# Patient Record
Sex: Male | Born: 1955 | Race: White | Hispanic: No | Marital: Married | Smoking: Former smoker
Health system: Southern US, Community
[De-identification: ages and names within clinical notes are randomized; demographics above are authoritative.]

## PROBLEM LIST (undated history)

## (undated) ENCOUNTER — Ambulatory Visit

## (undated) DIAGNOSIS — F419 Anxiety disorder, unspecified: Secondary | ICD-10-CM

## (undated) DIAGNOSIS — C801 Malignant (primary) neoplasm, unspecified: Secondary | ICD-10-CM

## (undated) DIAGNOSIS — E785 Hyperlipidemia, unspecified: Secondary | ICD-10-CM

## (undated) DIAGNOSIS — Q251 Coarctation of aorta: Secondary | ICD-10-CM

## (undated) DIAGNOSIS — I779 Disorder of arteries and arterioles, unspecified: Secondary | ICD-10-CM

## (undated) DIAGNOSIS — I739 Peripheral vascular disease, unspecified: Secondary | ICD-10-CM

## (undated) DIAGNOSIS — M1990 Unspecified osteoarthritis, unspecified site: Secondary | ICD-10-CM

## (undated) DIAGNOSIS — F32A Depression, unspecified: Secondary | ICD-10-CM

## (undated) DIAGNOSIS — Z87891 Personal history of nicotine dependence: Secondary | ICD-10-CM

## (undated) DIAGNOSIS — C439 Malignant melanoma of skin, unspecified: Secondary | ICD-10-CM

## (undated) DIAGNOSIS — R011 Cardiac murmur, unspecified: Secondary | ICD-10-CM

## (undated) DIAGNOSIS — T7840XA Allergy, unspecified, initial encounter: Secondary | ICD-10-CM

## (undated) DIAGNOSIS — I10 Essential (primary) hypertension: Secondary | ICD-10-CM

## (undated) DIAGNOSIS — F1910 Other psychoactive substance abuse, uncomplicated: Secondary | ICD-10-CM

## (undated) HISTORY — DX: Allergy, unspecified, initial encounter: T78.40XA

## (undated) HISTORY — PX: COLONOSCOPY: SHX174

## (undated) HISTORY — DX: Hyperlipidemia, unspecified: E78.5

## (undated) HISTORY — DX: Disorder of arteries and arterioles, unspecified: I77.9

## (undated) HISTORY — DX: Coarctation of aorta: Q25.1

## (undated) HISTORY — DX: Unspecified osteoarthritis, unspecified site: M19.90

## (undated) HISTORY — DX: Other psychoactive substance abuse, uncomplicated: F19.10

## (undated) HISTORY — DX: Malignant (primary) neoplasm, unspecified: C80.1

## (undated) HISTORY — DX: Anxiety disorder, unspecified: F41.9

## (undated) HISTORY — PX: AORTA SURGERY: SHX548

## (undated) HISTORY — DX: Depression, unspecified: F32.A

## (undated) HISTORY — DX: Malignant melanoma of skin, unspecified: C43.9

## (undated) HISTORY — DX: Essential (primary) hypertension: I10

## (undated) HISTORY — DX: Peripheral vascular disease, unspecified: I73.9

## (undated) HISTORY — DX: Cardiac murmur, unspecified: R01.1

## (undated) HISTORY — PX: FOOT SURGERY: SHX648

## (undated) HISTORY — DX: Personal history of nicotine dependence: Z87.891

---

## 1988-03-13 DIAGNOSIS — C439 Malignant melanoma of skin, unspecified: Secondary | ICD-10-CM

## 1988-03-13 HISTORY — PX: MELANOMA EXCISION: SHX5266

## 1988-03-13 HISTORY — DX: Malignant melanoma of skin, unspecified: C43.9

## 1999-01-04 ENCOUNTER — Encounter: Admission: RE | Admit: 1999-01-04 | Discharge: 1999-04-04 | Payer: Self-pay | Admitting: Specialist

## 1999-01-22 ENCOUNTER — Emergency Department (HOSPITAL_COMMUNITY): Admission: EM | Admit: 1999-01-22 | Discharge: 1999-01-23 | Payer: Self-pay | Admitting: Emergency Medicine

## 1999-03-13 ENCOUNTER — Emergency Department (HOSPITAL_COMMUNITY): Admission: EM | Admit: 1999-03-13 | Discharge: 1999-03-13 | Payer: Self-pay | Admitting: Emergency Medicine

## 1999-11-14 ENCOUNTER — Emergency Department (HOSPITAL_COMMUNITY): Admission: EM | Admit: 1999-11-14 | Discharge: 1999-11-14 | Payer: Self-pay | Admitting: Emergency Medicine

## 1999-11-25 ENCOUNTER — Emergency Department (HOSPITAL_COMMUNITY): Admission: EM | Admit: 1999-11-25 | Discharge: 1999-11-25 | Payer: Self-pay | Admitting: Emergency Medicine

## 2000-02-11 ENCOUNTER — Emergency Department (HOSPITAL_COMMUNITY): Admission: EM | Admit: 2000-02-11 | Discharge: 2000-02-11 | Payer: Self-pay | Admitting: Emergency Medicine

## 2000-05-15 ENCOUNTER — Emergency Department (HOSPITAL_COMMUNITY): Admission: EM | Admit: 2000-05-15 | Discharge: 2000-05-15 | Payer: Self-pay | Admitting: Emergency Medicine

## 2000-09-19 ENCOUNTER — Emergency Department (HOSPITAL_COMMUNITY): Admission: EM | Admit: 2000-09-19 | Discharge: 2000-09-20 | Payer: Self-pay | Admitting: Emergency Medicine

## 2000-11-05 ENCOUNTER — Emergency Department (HOSPITAL_COMMUNITY): Admission: EM | Admit: 2000-11-05 | Discharge: 2000-11-05 | Payer: Self-pay | Admitting: Emergency Medicine

## 2000-12-13 ENCOUNTER — Emergency Department (HOSPITAL_COMMUNITY): Admission: EM | Admit: 2000-12-13 | Discharge: 2000-12-13 | Payer: Self-pay

## 2001-01-15 ENCOUNTER — Emergency Department (HOSPITAL_COMMUNITY): Admission: EM | Admit: 2001-01-15 | Discharge: 2001-01-15 | Payer: Self-pay

## 2001-04-05 ENCOUNTER — Emergency Department (HOSPITAL_COMMUNITY): Admission: EM | Admit: 2001-04-05 | Discharge: 2001-04-05 | Payer: Self-pay | Admitting: Emergency Medicine

## 2001-06-30 ENCOUNTER — Emergency Department (HOSPITAL_COMMUNITY): Admission: EM | Admit: 2001-06-30 | Discharge: 2001-06-30 | Payer: Self-pay | Admitting: Emergency Medicine

## 2001-09-09 ENCOUNTER — Emergency Department (HOSPITAL_COMMUNITY): Admission: EM | Admit: 2001-09-09 | Discharge: 2001-09-09 | Payer: Self-pay | Admitting: Emergency Medicine

## 2005-08-23 ENCOUNTER — Encounter: Admission: RE | Admit: 2005-08-23 | Discharge: 2005-08-23 | Payer: Self-pay | Admitting: Neurology

## 2005-10-12 ENCOUNTER — Ambulatory Visit: Payer: Self-pay

## 2006-01-26 ENCOUNTER — Emergency Department (HOSPITAL_COMMUNITY): Admission: EM | Admit: 2006-01-26 | Discharge: 2006-01-26 | Payer: Self-pay | Admitting: Emergency Medicine

## 2010-01-11 ENCOUNTER — Encounter: Admission: RE | Admit: 2010-01-11 | Discharge: 2010-01-11 | Payer: Self-pay | Admitting: Neurology

## 2011-03-25 ENCOUNTER — Ambulatory Visit (INDEPENDENT_AMBULATORY_CARE_PROVIDER_SITE_OTHER): Payer: BC Managed Care – PPO

## 2011-03-25 DIAGNOSIS — M546 Pain in thoracic spine: Secondary | ICD-10-CM

## 2011-03-25 DIAGNOSIS — M62838 Other muscle spasm: Secondary | ICD-10-CM

## 2011-03-25 DIAGNOSIS — M79609 Pain in unspecified limb: Secondary | ICD-10-CM

## 2011-04-02 ENCOUNTER — Ambulatory Visit (INDEPENDENT_AMBULATORY_CARE_PROVIDER_SITE_OTHER): Payer: BC Managed Care – PPO

## 2011-04-02 DIAGNOSIS — R05 Cough: Secondary | ICD-10-CM

## 2011-04-02 DIAGNOSIS — M546 Pain in thoracic spine: Secondary | ICD-10-CM

## 2011-05-30 ENCOUNTER — Other Ambulatory Visit: Payer: Self-pay | Admitting: Neurology

## 2011-05-30 DIAGNOSIS — R0989 Other specified symptoms and signs involving the circulatory and respiratory systems: Secondary | ICD-10-CM

## 2011-06-01 ENCOUNTER — Ambulatory Visit
Admission: RE | Admit: 2011-06-01 | Discharge: 2011-06-01 | Disposition: A | Discharge status: Home / Self Care | Payer: PRIVATE HEALTH INSURANCE | Source: Ambulatory Visit | Attending: Neurology | Admitting: Neurology

## 2011-06-01 DIAGNOSIS — R0989 Other specified symptoms and signs involving the circulatory and respiratory systems: Secondary | ICD-10-CM

## 2011-07-01 ENCOUNTER — Ambulatory Visit (INDEPENDENT_AMBULATORY_CARE_PROVIDER_SITE_OTHER): Payer: BC Managed Care – PPO | Admitting: Family Medicine

## 2011-07-01 VITALS — BP 135/87 | HR 70 | Temp 98.1°F | Resp 16 | Ht 69.5 in | Wt 162.0 lb

## 2011-07-01 DIAGNOSIS — Z8774 Personal history of (corrected) congenital malformations of heart and circulatory system: Secondary | ICD-10-CM | POA: Insufficient documentation

## 2011-07-01 DIAGNOSIS — R071 Chest pain on breathing: Secondary | ICD-10-CM

## 2011-07-01 DIAGNOSIS — R0789 Other chest pain: Secondary | ICD-10-CM

## 2011-07-01 MED ORDER — METHYLPREDNISOLONE 4 MG PO KIT: PACK | ORAL | Status: AC

## 2011-07-01 NOTE — Progress Notes (Signed)
56 yo man with 3 months of right parascapular pain with deep breath or reaching forward.  Got some relief from the prednisone in the past  O: point tender right parascapular bony prominence. Chest clear Heart:  Low pitched systolic murmur best heard at the left sternal border  Occasional skipped beat  A:  Rib subluxation  P:  Chiropractor may help Medrol dospak

## 2011-07-01 NOTE — Patient Instructions (Signed)
Rib subluxation   Chest Wall Pain Chest wall pain is pain in or around the bones and muscles of your chest. It may take up to 6 weeks to get better. It may take longer if you must stay physically active in your work and activities.  CAUSES  Chest wall pain may happen on its own. However, it may be caused by:  A viral illness like the flu.   Injury.   Coughing.   Exercise.   Arthritis.   Fibromyalgia.   Shingles.  HOME CARE INSTRUCTIONS   Avoid overtiring physical activity. Try not to strain or perform activities that cause pain. This includes any activities using your chest or your abdominal and side muscles, especially if heavy weights are used.   Put ice on the sore area.   Put ice in a plastic bag.   Place a towel between your skin and the bag.   Leave the ice on for 15 to 20 minutes per hour while awake for the first 2 days.   Only take over-the-counter or prescription medicines for pain, discomfort, or fever as directed by your caregiver.  SEEK IMMEDIATE MEDICAL CARE IF:   Your pain increases, or you are very uncomfortable.   You have a fever.   Your chest pain becomes worse.   You have new, unexplained symptoms.   You have nausea or vomiting.   You feel sweaty or lightheaded.   You have a cough with phlegm (sputum), or you cough up blood.  MAKE SURE YOU:   Understand these instructions.   Will watch your condition.   Will get help right away if you are not doing well or get worse.  Document Released: 02/27/2005 Document Revised: 02/16/2011 Document Reviewed: 10/24/2010 [facility] Patient Information 2012 [place], [place].

## 2011-07-05 ENCOUNTER — Telehealth: Payer: Self-pay

## 2011-07-05 NOTE — Telephone Encounter (Signed)
LMOM  To call back

## 2011-07-05 NOTE — Telephone Encounter (Signed)
LMOM that he could pick xray up at front desk.

## 2011-07-05 NOTE — Telephone Encounter (Signed)
PT REQUESTING COPIES OF CHEST X-RAY (CD)    BEST PHONE [phone]

## 2011-09-12 ENCOUNTER — Ambulatory Visit (INDEPENDENT_AMBULATORY_CARE_PROVIDER_SITE_OTHER): Payer: BC Managed Care – PPO | Admitting: Family Medicine

## 2011-09-12 VITALS — BP 128/78 | HR 66 | Temp 98.6°F | Resp 16 | Ht 69.5 in | Wt 155.0 lb

## 2011-09-12 DIAGNOSIS — M7711 Lateral epicondylitis, right elbow: Secondary | ICD-10-CM

## 2011-09-12 DIAGNOSIS — M7710 Lateral epicondylitis, unspecified elbow: Secondary | ICD-10-CM

## 2011-09-12 NOTE — Progress Notes (Signed)
Subjective:    Patient ID: Gabriel Pittman, male    DOB: 01-16-1956, 56 y.o.   MRN: [id number]  HPI Gabriel Pittman is a 56 y.o. male Hx of R elbow pain - R lateral epicondylosis.  Bowler.  Seen 10/12: counterforce bracing 11/12- recheck with Dr. Georgiana Shore - kenalog 40mg  and 1cc lidocaine injected.  1/13: seen again by Dr. Georgiana Shore - prednisone taper, and working on technique.   Went to [facility], had PT - 5 episodes.  Working on [employer].   R elbow sore again - past 2 months. Would like injection.  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   Only using sometimes.  Tried some different grips with bowling to help - no relief.   Review of Systems  Musculoskeletal:       Pain.  Skin: Negative for pallor and rash.         Objective:   Physical Exam  Constitutional: He is oriented to person, place, and time. He appears well-developed and well-nourished.  HENT:  Head: Normocephalic and atraumatic.  Pulmonary/Chest: Effort normal.  Musculoskeletal:       Right elbow: He exhibits normal range of motion, no swelling and no effusion. tenderness found. Lateral epicondyle tenderness noted.       Arms: Neurological: He is alert and oriented to person, place, and time.  Skin: Skin is warm and dry.  Psychiatric: He has a normal mood and affect. His behavior is normal.   Patient appeared irritated after I discussed that repeat injection not advisable today due to worse long term outcomes.  He stated he had waited 2 1/2 hours for this.  I apologized for his wait, and advised him that in addition to my time evaluating him in the room, I also spent the other time in review of Up to Date recommendations, and phone consult with [facility] physician, to determine most advisable course for his condition as it was longstanding with recurrence.    Assessment & Plan:  [name] is a 56 y.o. male R lateral epicondylosis - recurrent.  Status post counterforce  bracing in 10/12, corticosteroid injection in 11/12, then prednisone taper in 1/13.  Status post physical therapy in March of this year. Improved initially for about 1-2 months after injection, so desired repeat injection today.  Discussed evidence of long term worse consequences with repeat injection, and for this reason injection was not a good option today. I also advised him this was not only my recommendation, but from Up To Date, and as recommended on phone discussion with other sports medicine specialist. Multiple other management options given to patient.  Discussed ultrasound or MRI of area to rule out tear with persistent symptoms, or referral to [facility] for ultrasound of tendon and eval - patient declined.  Discussed nitroglycerin patch to area - patient declined.  Discussed referral to hand specialist/ortho - patient declined.  He stated he plans on setting up his own appointment with Dr. Yisroel Ramming.  I told him that I would be happy to refer him to Dr. Yisroel Ramming for eval, but he declined my referral.  I did recommend he continue eccentric rehab HEP from PT and counterforce bracing with activity/work.  When I discussed analgesics as he did say that he had woken up with pain at times - he declined me writing any prescription for him to help with this and just wanted to leave.  If he calls and changes his mind about referral or above options, I can refer at that time.

## 2012-04-04 ENCOUNTER — Other Ambulatory Visit: Payer: Self-pay | Admitting: Neurology

## 2012-04-04 DIAGNOSIS — I6529 Occlusion and stenosis of unspecified carotid artery: Secondary | ICD-10-CM

## 2012-04-11 ENCOUNTER — Ambulatory Visit
Admission: RE | Admit: 2012-04-11 | Discharge: 2012-04-11 | Disposition: A | Discharge status: Home / Self Care | Payer: BC Managed Care – PPO | Source: Ambulatory Visit | Attending: Neurology | Admitting: Neurology

## 2012-04-11 DIAGNOSIS — I6529 Occlusion and stenosis of unspecified carotid artery: Secondary | ICD-10-CM

## 2012-12-26 ENCOUNTER — Other Ambulatory Visit: Payer: Self-pay | Admitting: Podiatry

## 2013-01-23 ENCOUNTER — Ambulatory Visit (INDEPENDENT_AMBULATORY_CARE_PROVIDER_SITE_OTHER): Payer: BC Managed Care – PPO | Admitting: Podiatry

## 2013-01-23 ENCOUNTER — Encounter: Payer: Self-pay | Admitting: Podiatry

## 2013-01-23 VITALS — BP 159/80 | HR 70 | Resp 12 | Ht 69.0 in | Wt 150.0 lb

## 2013-01-23 DIAGNOSIS — M7750 Other enthesopathy of unspecified foot: Secondary | ICD-10-CM

## 2013-01-23 MED ORDER — DICLOFENAC SODIUM 75 MG PO TBEC: 75.0000 mg | DELAYED_RELEASE_TABLET | Freq: Two times a day (BID) | ORAL | Status: DC

## 2013-01-23 NOTE — Progress Notes (Signed)
Subjective:     Patient ID: Gabriel Pittman, male   DOB: 07/08/1955, 57 y.o.   MRN: [id number]  HPI patient states that his heel is improved but that he is having pain underneath the ankle joint. States that he is walking better than previously with the heel pain   Review of Systems     Objective:   Physical Exam  Nursing note and vitals reviewed. Constitutional: He is oriented to person, place, and time.  Cardiovascular: Intact distal pulses.   Musculoskeletal: Normal range of motion.  Neurological: He is oriented to person, place, and time.  Skin: Skin is warm.   patient is found to have pain around the posterior tibial tendon left with mild inflammation noted. Heel itself is doing very well     Assessment:     Posterior tibial tendinitis left with improved plantar fasciitis    Plan:     Advised on posterior tibial tendinitis and discussed ice therapy and diclofenac oral anti-inflammatory. If not improved we'll need to consider injection of this area

## 2013-02-20 ENCOUNTER — Encounter: Payer: Self-pay | Admitting: Podiatry

## 2013-02-20 ENCOUNTER — Ambulatory Visit (INDEPENDENT_AMBULATORY_CARE_PROVIDER_SITE_OTHER): Payer: BC Managed Care – PPO | Admitting: Podiatry

## 2013-02-20 VITALS — BP 136/76 | HR 66 | Resp 16

## 2013-02-20 DIAGNOSIS — M722 Plantar fascial fibromatosis: Secondary | ICD-10-CM

## 2013-02-20 MED ORDER — TRIAMCINOLONE ACETONIDE 10 MG/ML IJ SUSP
10.0000 mg | Freq: Once | INTRAMUSCULAR | Status: AC
  Administered 2013-02-20: 10 mg

## 2013-02-24 MED ORDER — TRIAMCINOLONE ACETONIDE 10 MG/ML IJ SUSP: 10.0000 mg | Freq: Once | INTRAMUSCULAR | Status: DC

## 2013-02-24 NOTE — Progress Notes (Signed)
Subjective:     Patient ID: Gabriel Pittman, male   DOB: 02/28/1956, 57 y.o.   MRN: [id number]  HPI patient states that his heel is still bothering him but improved from previously   Review of Systems     Objective:   Physical Exam Neurovascular status intact with pain to palpation to the plantar heel still noted with the posterior tibial tendon doing well    Assessment:     Plantar fasciitis left noted    Plan:     Injected the left plantar fascia 3 mm, 5 of them Xylocaine Marcaine mixture and gave advice on physical therapy orthotics and support

## 2013-03-20 ENCOUNTER — Encounter: Payer: Self-pay | Admitting: Podiatry

## 2013-03-20 ENCOUNTER — Ambulatory Visit (INDEPENDENT_AMBULATORY_CARE_PROVIDER_SITE_OTHER): Payer: BC Managed Care – PPO | Admitting: Podiatry

## 2013-03-20 VITALS — BP 153/85 | HR 67 | Resp 16 | Ht 69.0 in | Wt 150.0 lb

## 2013-03-20 DIAGNOSIS — M722 Plantar fascial fibromatosis: Secondary | ICD-10-CM

## 2013-03-20 MED ORDER — TRIAMCINOLONE ACETONIDE 10 MG/ML IJ SUSP
10.0000 mg | Freq: Once | INTRAMUSCULAR | Status: AC
  Administered 2013-03-20: 10 mg

## 2013-03-20 NOTE — Progress Notes (Signed)
Pt presents for follow up for plantar fasciitis and medial foot pain.

## 2013-03-20 NOTE — Progress Notes (Signed)
Subjective:     Patient ID: Gabriel Pittman, male   DOB: 26-May-1955, 58 y.o.   MRN: [id number]  HPI patient is doing much better with heel pain but on the right foot a slightly bit more proximal there is inflammation of the medial band   Review of Systems     Objective:   Physical Exam Neurovascular status unchanged with discomfort of a more distal direction on the plantar fascia right with good heel at the current time    Assessment:     Distal plantar fasciitis in one small area noted    Plan:     Careful injection administered to this area 3 mg Kenalog 5 mg Xylocaine Marcaine mixture and patient will be seen back as needed

## 2013-04-05 ENCOUNTER — Ambulatory Visit (INDEPENDENT_AMBULATORY_CARE_PROVIDER_SITE_OTHER): Payer: BC Managed Care – PPO | Admitting: Family Medicine

## 2013-04-05 VITALS — BP 128/60 | HR 50 | Temp 98.4°F | Resp 16 | Ht 69.0 in | Wt 148.0 lb

## 2013-04-05 DIAGNOSIS — M542 Cervicalgia: Secondary | ICD-10-CM

## 2013-04-05 MED ORDER — METAXALONE 800 MG PO TABS: 800.0000 mg | ORAL_TABLET | Freq: Three times a day (TID) | ORAL | Status: DC

## 2013-04-05 MED ORDER — HYDROCODONE-ACETAMINOPHEN 10-325 MG PO TABS: 1.0000 | ORAL_TABLET | Freq: Three times a day (TID) | ORAL | Status: DC | PRN

## 2013-04-05 NOTE — Patient Instructions (Signed)
Take the Skelaxin one 3 times daily  Continue the ibuprofen for an 800 mg (4x200)  3 times daily  Hydrocodone 5 mg every 4-6 hours if needed for pain  Ice/heat alternating  Return if worse or not improving

## 2013-04-05 NOTE — Progress Notes (Signed)
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 He does a lot of physical exercise also. One week ago today he was doing stretches of his back and neck and hold his left side of the neck. It is continued to hurt him since then. Last for 5 nights she's not been able to rest because of it. He has been taking over-the-counter ibuprofen. Relief is only transient. He continues to hurt in the left posterior neck. It feels tight but no radiation. He gets a lot of exercise on a regular basis anyhow. It just is not resolved over the last week.  He has had MRI scans of his neck in the past and knows that he has cervical arthritis.  Objective: Point tenderness in the right cervical area. neck has satisfactory range of motion. There is not much tenderness in the surrounding tissues. Neck hurts with flexion or rotation grip and function of his upper trapezius good  Assessment: Cervical strain Cervical arthritis  Plan: Skelaxin If the ibuprofen Hydrocodone 5/325 when necessary  If he does not improve we will consider some physical therapy and/or further studies

## 2013-05-02 ENCOUNTER — Other Ambulatory Visit: Payer: Self-pay | Admitting: Neurology

## 2013-05-02 DIAGNOSIS — I6529 Occlusion and stenosis of unspecified carotid artery: Secondary | ICD-10-CM

## 2013-05-08 ENCOUNTER — Other Ambulatory Visit: Payer: BC Managed Care – PPO

## 2013-05-09 ENCOUNTER — Ambulatory Visit
Admission: RE | Admit: 2013-05-09 | Discharge: 2013-05-09 | Disposition: A | Discharge status: Home / Self Care | Payer: BC Managed Care – PPO | Source: Ambulatory Visit | Attending: Neurology | Admitting: Neurology

## 2013-05-09 DIAGNOSIS — I6529 Occlusion and stenosis of unspecified carotid artery: Secondary | ICD-10-CM

## 2013-05-14 ENCOUNTER — Other Ambulatory Visit: Payer: Self-pay | Admitting: Neurology

## 2013-05-14 DIAGNOSIS — I6521 Occlusion and stenosis of right carotid artery: Secondary | ICD-10-CM

## 2013-05-21 ENCOUNTER — Ambulatory Visit
Admission: RE | Admit: 2013-05-21 | Discharge: 2013-05-21 | Disposition: A | Discharge status: Home / Self Care | Payer: BC Managed Care – PPO | Source: Ambulatory Visit | Attending: Neurology | Admitting: Neurology

## 2013-05-21 DIAGNOSIS — I6521 Occlusion and stenosis of right carotid artery: Secondary | ICD-10-CM

## 2013-05-21 MED ORDER — GADOBENATE DIMEGLUMINE 529 MG/ML IV SOLN
13.0000 mL | Freq: Once | INTRAVENOUS | Status: AC | PRN
  Administered 2013-05-21: 13 mL via INTRAVENOUS

## 2013-05-23 ENCOUNTER — Other Ambulatory Visit: Payer: Self-pay | Admitting: Neurology

## 2013-05-23 DIAGNOSIS — I6529 Occlusion and stenosis of unspecified carotid artery: Secondary | ICD-10-CM

## 2013-05-29 ENCOUNTER — Ambulatory Visit
Admission: RE | Admit: 2013-05-29 | Discharge: 2013-05-29 | Disposition: A | Discharge status: Home / Self Care | Payer: BC Managed Care – PPO | Source: Ambulatory Visit | Attending: Neurology | Admitting: Neurology

## 2013-05-29 DIAGNOSIS — I6529 Occlusion and stenosis of unspecified carotid artery: Secondary | ICD-10-CM

## 2013-05-29 MED ORDER — IOHEXOL 350 MG/ML SOLN
100.0000 mL | Freq: Once | INTRAVENOUS | Status: AC | PRN
  Administered 2013-05-29: 100 mL via INTRAVENOUS

## 2013-09-01 ENCOUNTER — Ambulatory Visit (INDEPENDENT_AMBULATORY_CARE_PROVIDER_SITE_OTHER): Payer: BC Managed Care – PPO | Admitting: Podiatry

## 2013-09-01 ENCOUNTER — Encounter: Payer: Self-pay | Admitting: Podiatry

## 2013-09-01 DIAGNOSIS — M7750 Other enthesopathy of unspecified foot: Secondary | ICD-10-CM

## 2013-09-01 MED ORDER — TRIAMCINOLONE ACETONIDE 10 MG/ML IJ SUSP
10.0000 mg | Freq: Once | INTRAMUSCULAR | Status: AC
  Administered 2013-09-01: 10 mg

## 2013-09-02 NOTE — Progress Notes (Signed)
Subjective:     Patient ID: Gabriel Pittman, male   DOB: 12-Aug-1955, 59 y.o.   MRN: [id number]  HPI patient presents stating he has had a reoccurrence of heel pain right and it is very tender at this time especially when he gets up in the morning   Review of Systems     Objective:   Physical Exam Neurovascular status intact no change in health history with exquisite discomfort plantar aspect right heel at the insertional point of the tendon into the calcaneus    Assessment:     Plan her fasciitis right heel with inflammation    Plan:     Reinjected the plantar fascia 3 mg Kenalog 5 mg like Marcaine mixture and dispensed night splint with all instructions on usage along with ice pack. Reappoint her recheck in 4 weeks and continue orthotics and physical therapy

## 2013-10-06 ENCOUNTER — Ambulatory Visit (INDEPENDENT_AMBULATORY_CARE_PROVIDER_SITE_OTHER): Payer: BC Managed Care – PPO | Admitting: Podiatry

## 2013-10-06 ENCOUNTER — Encounter: Payer: Self-pay | Admitting: Podiatry

## 2013-10-06 VITALS — BP 125/78 | HR 74 | Resp 17

## 2013-10-06 DIAGNOSIS — M7750 Other enthesopathy of unspecified foot: Secondary | ICD-10-CM

## 2013-10-06 DIAGNOSIS — M722 Plantar fascial fibromatosis: Secondary | ICD-10-CM

## 2013-10-06 MED ORDER — TRIAMCINOLONE ACETONIDE 10 MG/ML IJ SUSP
10.0000 mg | Freq: Once | INTRAMUSCULAR | Status: AC
  Administered 2013-10-06: 10 mg

## 2013-10-06 MED ORDER — DICLOFENAC SODIUM 75 MG PO TBEC: 75.0000 mg | DELAYED_RELEASE_TABLET | Freq: Two times a day (BID) | ORAL | Status: DC

## 2013-10-06 NOTE — Progress Notes (Signed)
Subjective:     Patient ID: Gabriel Pittman, male   DOB: 05/05/1955, 58 y.o.   MRN: [id number]  HPI patient presents stating this pain in the one area still bothers me. States the heel was better and the tendency [place] spot in between the get irritated   Review of Systems     Objective:   Physical Exam Neurovascular status intact with muscle strength adequate and patient noted to have discomfort not in the posterior tibial tendon or plantar fashion but somewhere in between with a small inflammatory area noted in this area    Assessment:     Appears to be some form of inflammatory condition it's difficult to determine exactly what it is    Plan:     Since this area is new I did do 1 injection 3 mg Kenalog 5 mg I can Marcaine mixture and I instructed him on continued boot usage orthotic usage and reappoint 4 weeks earlier if symptoms were to get worse

## 2013-10-06 NOTE — Progress Notes (Signed)
   Subjective:    Patient ID: Gabriel Pittman, male    DOB: 11/11/1955, 58 y.o.   MRN: [id number]  HPI  Pt is here for f/u injection for plantar fasciitis. He states that he noticed a 50% improvement, still tender/sore  Review of Systems     Objective:   Physical Exam        Assessment & Plan:

## 2013-11-03 ENCOUNTER — Ambulatory Visit (INDEPENDENT_AMBULATORY_CARE_PROVIDER_SITE_OTHER): Payer: BC Managed Care – PPO | Admitting: Podiatry

## 2013-11-03 ENCOUNTER — Encounter: Payer: Self-pay | Admitting: Podiatry

## 2013-11-03 VITALS — BP 145/78 | HR 68 | Resp 16

## 2013-11-03 DIAGNOSIS — M722 Plantar fascial fibromatosis: Secondary | ICD-10-CM

## 2013-11-03 NOTE — Progress Notes (Signed)
Subjective:     Patient ID: Gabriel Pittman, male   DOB: [birth year], 58 y.o.   MRN: [id number]  HPI patient states that the right heel is feeling much better   Review of Systems     Objective:   Physical Exam Neurovascular status intact with significant reduction discomfort that is noted in the right plantar heel    Assessment:     Patient doing well with significant reduction of pain upon palpate    Plan:     H&P reviewed and discussed physical therapy supportive shoe orthotics and night splint usage. Patient is discharged and less any issues should occur

## 2013-11-10 ENCOUNTER — Ambulatory Visit (INDEPENDENT_AMBULATORY_CARE_PROVIDER_SITE_OTHER): Payer: BC Managed Care – PPO | Admitting: Physician Assistant

## 2013-11-10 VITALS — BP 142/68 | HR 64 | Temp 98.0°F | Resp 16 | Ht 69.0 in | Wt 144.8 lb

## 2013-11-10 DIAGNOSIS — Z9889 Other specified postprocedural states: Secondary | ICD-10-CM

## 2013-11-10 DIAGNOSIS — Z85820 Personal history of malignant melanoma of skin: Secondary | ICD-10-CM | POA: Insufficient documentation

## 2013-11-10 DIAGNOSIS — L259 Unspecified contact dermatitis, unspecified cause: Secondary | ICD-10-CM

## 2013-11-10 DIAGNOSIS — L309 Dermatitis, unspecified: Secondary | ICD-10-CM

## 2013-11-10 MED ORDER — CLOTRIMAZOLE-BETAMETHASONE 1-0.05 % EX CREA: 1.0000 "application " | TOPICAL_CREAM | Freq: Two times a day (BID) | CUTANEOUS | Status: DC

## 2013-11-10 NOTE — Progress Notes (Signed)
   Subjective:    Patient ID: Gabriel Pittman, male    DOB: 1955/11/17, 58 y.o.   MRN: [id number]   PCP: Edison Nasuti, MD  Chief Complaint  Patient presents with  . Rash    Located on buttocks, x4 weeks    Medications, allergies, past medical history, surgical history, family history, social history and problem list reviewed and updated.  Patient Active Problem List   Diagnosis Date Noted  . History of melanoma excision 11/10/2013  . 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 Authorizing Provider  aspirin 325 MG tablet Take 166 mg by mouth daily.    Yes Historical Provider, MD  diclofenac (VOLTAREN) 75 MG EC tablet Take 1 tablet (75 mg total) by mouth 2 (two) times daily. 10/06/13  Yes Wallene Huh, DPM    HPI  "I think I have Monkey Butt." "I've had this kind of rash before. It usually goes away in a few days or a week." OTC Cortisone cream and Baby Oil without benefit. Switched from [employer] to [place] 2 days ago, and notes he feels some better today. Itchy and painful to sit.  He exercises regularly, doesn't always get out of his wet and sweaty clothes promptly.  Review of Systems     Objective:   Physical Exam  Constitutional: He is oriented to person, place, and time. He appears well-developed and well-nourished. No distress.  BP 142/68  Pulse 64  Temp(Src) 98 F (36.7 C) (Oral)  Resp 16  Ht 5\' 9"  (1.753 m)  Wt 144 lb 12.8 oz (65.681 kg)  BMI 21.37 kg/m2  SpO2 98%   Pulmonary/Chest: Effort normal.  Neurological: He is alert and oriented to person, place, and time.  Skin: Skin is warm and dry. There is erythema (surrounding the anus and extending several centimeters up the intergluteal cleft. No ulcerations. No satellite lesions.).  Psychiatric: He has a normal mood and affect. His speech is normal and behavior is normal.          Assessment & Plan:  1.  Dermatitis Continue with Dove soap rather than Dial. Change out of damp clothing as quickly as possible. - clotrimazole-betamethasone (LOTRISONE) cream; Apply 1 application topically 2 (two) times daily.  Dispense: 45 g; Refill: 1   Fara Chute, PA-C Physician Assistant-Certified [facility]

## 2013-11-10 NOTE — Patient Instructions (Signed)
Use the cream twice daily until the area is resolved and then 5 additional days. Return for re-evaluation if your symptoms persist in 2 weeks.  Try Body Glide to reduce chafing.

## 2013-12-24 ENCOUNTER — Ambulatory Visit (INDEPENDENT_AMBULATORY_CARE_PROVIDER_SITE_OTHER): Payer: BC Managed Care – PPO | Admitting: Physician Assistant

## 2013-12-24 VITALS — BP 120/80 | HR 61 | Temp 98.1°F | Resp 16 | Ht 69.0 in | Wt 145.0 lb

## 2013-12-24 DIAGNOSIS — R21 Rash and other nonspecific skin eruption: Secondary | ICD-10-CM

## 2013-12-24 LAB — POCT CBC
Granulocyte percent: 74.8 %G (ref 37–80)
HEMATOCRIT: 43 % — AB (ref 43.5–53.7)
HEMOGLOBIN: 14.2 g/dL (ref 14.1–18.1)
LYMPH, POC: 2 (ref 0.6–3.4)
MCH: 33.3 pg — AB (ref 27–31.2)
MCHC: 33 g/dL (ref 31.8–35.4)
MCV: 100.8 fL — AB (ref 80–97)
MID (cbc): 0.7 (ref 0–0.9)
MPV: 6.8 fL (ref 0–99.8)
POC Granulocyte: 7.8 — AB (ref 2–6.9)
POC LYMPH PERCENT: 18.8 %L (ref 10–50)
POC MID %: 6.4 %M (ref 0–12)
Platelet Count, POC: 250 10*3/uL (ref 142–424)
RBC: 4.27 M/uL — AB (ref 4.69–6.13)
RDW, POC: 13.8 %
WBC: 10.4 10*3/uL — AB (ref 4.6–10.2)

## 2013-12-24 LAB — GLUCOSE, POCT (MANUAL RESULT ENTRY): POC GLUCOSE: 64 mg/dL — AB (ref 70–99)

## 2013-12-24 MED ORDER — BALMEX 11.3 % EX CREA: 1.0000 "application " | TOPICAL_CREAM | Freq: Two times a day (BID) | CUTANEOUS | Status: DC

## 2013-12-24 NOTE — Progress Notes (Signed)
Subjective:    Patient ID: Gabriel Pittman, male    DOB: 1955/05/11, 58 y.o.   MRN: [id number]  HPI  This is a 58 year old male with 3.5 month history of perianal rash. He was seen here in 11/10/13 for this issue and was prescribed lotrisone cream. He reports the rash improved some during the first 2 weeks of using the cream but then worsened again. He is still using the cream twice a day. The rash is itchy. He reports it is painful to sit.  After last visit he has started being more conscious about taking his damp work out clothes off promptly. He had switched to using dove after he was last seen but has switched back to dial. He denies painful bowel movements, anal discharge, fever or chills.  Review of Systems  Constitutional: Negative.   HENT: Negative for sore throat.   Gastrointestinal: Positive for rectal pain. Negative for abdominal pain, diarrhea, blood in stool and anal bleeding.  Endocrine: Negative for polydipsia and polyuria.  Skin: Positive for rash (perianal).   Current Outpatient Prescriptions on File Prior to Visit  Medication Sig Dispense Refill  . aspirin 325 MG tablet Take 166 mg by mouth daily.       . clotrimazole-betamethasone (LOTRISONE) cream Apply 1 application topically 2 (two) times daily.  45 g  1  . diclofenac (VOLTAREN) 75 MG EC tablet Take 1 tablet (75 mg total) by mouth 2 (two) times daily.  50 tablet  2   No current facility-administered medications on file prior to visit.    Patient Active Problem List   Diagnosis Date Noted  . History of melanoma excision 11/10/2013  . 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 No distress.  HENT:  Head: Normocephalic and atraumatic.  Right Ear: Hearing normal.  Left Ear: Hearing normal.  Mouth/Throat: Uvula is midline and oropharynx is clear and moist. No oropharyngeal exudate or posterior  oropharyngeal erythema.  Eyes: Conjunctivae and lids are normal.  Genitourinary: Rectal exam shows no external hemorrhoid and no fissure.  Perianal erythema extending into intergluteal cleft. No satellite lesions. No skin erosions or blisters.  Neurological: He is alert.  Skin: Skin is warm and dry. Rash (perianal) noted. He is not diaphoretic.  Psychiatric: He has a normal mood and affect. His speech is normal and behavior is normal. Thought content normal.   Results for orders placed in visit on 12/24/13  POCT CBC      Result Value Ref Range   WBC 10.4 (*) 4.6 - 10.2 K/uL   Lymph, poc 2.0  0.6 - 3.4   POC LYMPH PERCENT 18.8  10 - 50 %L   MID (cbc) 0.7  0 - 0.9   POC MID % 6.4  0 - 12 %M   POC Granulocyte 7.8 (*) 2 - 6.9   Granulocyte percent 74.8  37 - 80 %G   RBC 4.27 (*) 4.69 - 6.13 M/uL   Hemoglobin 14.2  14.1 - 18.1 g/dL   HCT, POC 43.0 (*) 43.5 - 53.7 %   MCV 100.8 (*) 80 - 97 fL   MCH, POC 33.3 (*) 27 - 31.2 pg   MCHC 33.0  31.8 - 35.4 g/dL   RDW, POC 13.8     Platelet Count, POC 250  142 - 424 K/uL   MPV 6.8  0 - 99.8 fL  GLUCOSE, POCT (MANUAL RESULT ENTRY)      Result Value Ref Range   POC Glucose 64 (*) 70 - 99 mg/dl       Assessment & Plan:  1. Perianal rash  Etiology unknown. Glucose was not elevated, reassured he does not have diabetes. Wbc normal. It is possible the rash is exacerbated by prolonged steroid use. He will discontinue the lotrisone cream. He will start using balmex for barrier protection. He will switch back to dove soap from dial. If this rash continues for longer than 2 weeks he will return. At that time, would consider referral to dermatology or gastroenterology. - POCT CBC - POCT glucose (manual entry) - BALMEX 11.3 % CREA cream; Apply 1 application topically 2 (two) times daily.  Dispense: 1 Tube; Refill: 0   Benjaman Pott. Drenda Freeze, MHS [facility]  12/24/2013

## 2013-12-24 NOTE — Patient Instructions (Signed)
Return in 2 weeks if not improved. Stop steroid cream. Start using dove again.  Eczema Eczema, also called atopic dermatitis, is a skin disorder that causes inflammation of the skin. It causes a red rash and dry, scaly skin. The skin becomes very itchy. Eczema is generally worse during the cooler winter months and often improves with the warmth of summer. Eczema usually starts showing signs in infancy. Some children outgrow eczema, but it may last through adulthood.  CAUSES  The exact cause of eczema is not known, but it appears to run in families. People with eczema often have a family history of eczema, allergies, asthma, or hay fever. Eczema is not contagious. Flare-ups of the condition may be caused by:   Contact with something you are sensitive or allergic to.   Stress. SIGNS AND SYMPTOMS  Dry, scaly skin.   Red, itchy rash.   Itchiness. This may occur before the skin rash and may be very intense.  DIAGNOSIS  The diagnosis of eczema is usually made based on symptoms and medical history. TREATMENT  Eczema cannot be cured, but symptoms usually can be controlled with treatment and other strategies. A treatment plan might include:  Controlling the itching and scratching.   Use over-the-counter antihistamines as directed for itching. This is especially useful at night when the itching tends to be worse.   Use over-the-counter steroid creams as directed for itching.   Avoid scratching. Scratching makes the rash and itching worse. It may also result in a skin infection (impetigo) due to a break in the skin caused by scratching.   Keeping the skin well moisturized with creams every day. This will seal in moisture and help prevent dryness. Lotions that contain alcohol and water should be avoided because they can dry the skin.   Limiting exposure to things that you are sensitive or allergic to (allergens).   Recognizing situations that cause stress.   Developing a plan to  manage stress.  HOME CARE INSTRUCTIONS   Only take over-the-counter or prescription medicines as directed by your health care provider.   Do not use anything on the skin without checking with your health care provider.   Keep baths or showers short (5 minutes) in warm (not hot) water. Use mild cleansers for bathing. These should be unscented. You may add nonperfumed bath oil to the bath water. It is best to avoid soap and bubble bath.   Immediately after a bath or shower, when the skin is still damp, apply a moisturizing ointment to the entire body. This ointment should be a petroleum ointment. This will seal in moisture and help prevent dryness. The thicker the ointment, the better. These should be unscented.   Keep fingernails cut short. Children with eczema may need to wear soft gloves or mittens at night after applying an ointment.   Dress in clothes made of cotton or cotton blends. Dress lightly, because heat increases itching.   A child with eczema should stay away from anyone with fever blisters or cold sores. The virus that causes fever blisters (herpes simplex) can cause a serious skin infection in children with eczema. SEEK MEDICAL CARE IF:   Your itching interferes with sleep.   Your rash gets worse or is not better within 1 week after starting treatment.   You see pus or soft yellow scabs in the rash area.   You have a fever.   You have a rash flare-up after contact with someone who has fever blisters.  Document Released:  02/25/2000 Document Revised: 12/18/2012 Document Reviewed: 09/30/2012 [facility] Patient Information 2015 [place], [place]. This information is not intended to replace advice given to you by your health care provider. Make sure you discuss any questions you have with your health care provider.

## 2013-12-26 NOTE — Progress Notes (Signed)
I have examined this patient along with Ms. Drenda Freeze and agree.

## 2014-01-07 ENCOUNTER — Ambulatory Visit (INDEPENDENT_AMBULATORY_CARE_PROVIDER_SITE_OTHER): Payer: BC Managed Care – PPO | Admitting: Podiatry

## 2014-01-07 ENCOUNTER — Encounter: Payer: Self-pay | Admitting: Podiatry

## 2014-01-07 VITALS — BP 162/78 | HR 66 | Resp 16

## 2014-01-07 DIAGNOSIS — M779 Enthesopathy, unspecified: Secondary | ICD-10-CM

## 2014-01-07 MED ORDER — TRIAMCINOLONE ACETONIDE 10 MG/ML IJ SUSP
10.0000 mg | Freq: Once | INTRAMUSCULAR | Status: AC
  Administered 2014-01-07: 10 mg

## 2014-01-08 NOTE — Progress Notes (Signed)
Subjective:     Patient ID: Gabriel Pittman, male   DOB: 12/04/1955, 58 y.o.   MRN: [id number]  HPI patient presents stating my heel is doing great but I'm having discomfort around that posterior tibial tendon again and I feel like if I could just have a little bit of medicine around it it would get better   Review of Systems     Objective:   Physical Exam Neurovascular status intact with muscle strength adequate and range of motion subtalar midtarsal joint within normal limits. Patient is noted to have mild inflammation around and plantar to the posterior tibial tendon right with no indications of muscle strength loss or indications of excessive swelling with possibility for tear    Assessment:     Probable tendinitis of the right posterior tibial    Plan:     Reviewed condition and did careful injection 3 mg Kenalog 5 mg Xylocaine into the sheath and plantar. Tolerated well and reappoint if symptoms persist for consideration of MRI

## 2014-12-12 DIAGNOSIS — D126 Benign neoplasm of colon, unspecified: Secondary | ICD-10-CM

## 2014-12-12 HISTORY — DX: Benign neoplasm of colon, unspecified: D12.6

## 2014-12-15 ENCOUNTER — Encounter: Payer: Self-pay | Admitting: Podiatry

## 2014-12-15 ENCOUNTER — Ambulatory Visit (INDEPENDENT_AMBULATORY_CARE_PROVIDER_SITE_OTHER): Payer: BLUE CROSS/BLUE SHIELD

## 2014-12-15 ENCOUNTER — Ambulatory Visit (INDEPENDENT_AMBULATORY_CARE_PROVIDER_SITE_OTHER): Payer: BLUE CROSS/BLUE SHIELD | Admitting: Podiatry

## 2014-12-15 DIAGNOSIS — M779 Enthesopathy, unspecified: Secondary | ICD-10-CM

## 2014-12-15 DIAGNOSIS — M722 Plantar fascial fibromatosis: Secondary | ICD-10-CM

## 2014-12-15 MED ORDER — TRIAMCINOLONE ACETONIDE 10 MG/ML IJ SUSP
10.0000 mg | Freq: Once | INTRAMUSCULAR | Status: AC
  Administered 2014-12-15: 10 mg

## 2014-12-16 ENCOUNTER — Encounter: Payer: Self-pay | Admitting: Family Medicine

## 2014-12-16 ENCOUNTER — Telehealth: Payer: Self-pay | Admitting: *Deleted

## 2014-12-16 ENCOUNTER — Ambulatory Visit (INDEPENDENT_AMBULATORY_CARE_PROVIDER_SITE_OTHER): Payer: BLUE CROSS/BLUE SHIELD | Admitting: Family Medicine

## 2014-12-16 VITALS — BP 168/72 | HR 60 | Temp 98.3°F | Resp 16 | Ht 69.0 in | Wt 145.2 lb

## 2014-12-16 DIAGNOSIS — Z0000 Encounter for general adult medical examination without abnormal findings: Secondary | ICD-10-CM

## 2014-12-16 DIAGNOSIS — Z125 Encounter for screening for malignant neoplasm of prostate: Secondary | ICD-10-CM | POA: Diagnosis not present

## 2014-12-16 DIAGNOSIS — Z1211 Encounter for screening for malignant neoplasm of colon: Secondary | ICD-10-CM

## 2014-12-16 DIAGNOSIS — F17200 Nicotine dependence, unspecified, uncomplicated: Secondary | ICD-10-CM

## 2014-12-16 DIAGNOSIS — Z1321 Encounter for screening for nutritional disorder: Secondary | ICD-10-CM | POA: Diagnosis not present

## 2014-12-16 DIAGNOSIS — IMO0001 Reserved for inherently not codable concepts without codable children: Secondary | ICD-10-CM

## 2014-12-16 DIAGNOSIS — R030 Elevated blood-pressure reading, without diagnosis of hypertension: Secondary | ICD-10-CM | POA: Diagnosis not present

## 2014-12-16 DIAGNOSIS — Z13220 Encounter for screening for lipoid disorders: Secondary | ICD-10-CM

## 2014-12-16 DIAGNOSIS — Z23 Encounter for immunization: Secondary | ICD-10-CM | POA: Diagnosis not present

## 2014-12-16 LAB — COMPREHENSIVE METABOLIC PANEL
ALBUMIN: 4.5 g/dL (ref 3.6–5.1)
ALK PHOS: 47 U/L (ref 40–115)
ALT: 20 U/L (ref 9–46)
AST: 22 U/L (ref 10–35)
BILIRUBIN TOTAL: 0.5 mg/dL (ref 0.2–1.2)
BUN: 18 mg/dL (ref 7–25)
CALCIUM: 9 mg/dL (ref 8.6–10.3)
CO2: 28 mmol/L (ref 20–31)
Chloride: 104 mmol/L (ref 98–110)
Creat: 0.87 mg/dL (ref 0.70–1.25)
GLUCOSE: 97 mg/dL (ref 65–99)
Potassium: 4.5 mmol/L (ref 3.5–5.3)
Sodium: 139 mmol/L (ref 135–146)
TOTAL PROTEIN: 6.8 g/dL (ref 6.1–8.1)

## 2014-12-16 LAB — POCT URINALYSIS DIP (MANUAL ENTRY)
BILIRUBIN UA: NEGATIVE
GLUCOSE UA: NEGATIVE
Ketones, POC UA: NEGATIVE
LEUKOCYTES UA: NEGATIVE
NITRITE UA: NEGATIVE
Protein Ur, POC: NEGATIVE
Spec Grav, UA: 1.015
UROBILINOGEN UA: 0.2
pH, UA: 7

## 2014-12-16 LAB — CBC
HEMATOCRIT: 40.5 % (ref 39.0–52.0)
HEMOGLOBIN: 13.8 g/dL (ref 13.0–17.0)
MCH: 32.7 pg (ref 26.0–34.0)
MCHC: 34.1 g/dL (ref 30.0–36.0)
MCV: 96 fL (ref 78.0–100.0)
MPV: 9.4 fL (ref 8.6–12.4)
Platelets: 282 10*3/uL (ref 150–400)
RBC: 4.22 MIL/uL (ref 4.22–5.81)
RDW: 13.1 % (ref 11.5–15.5)
WBC: 8.8 10*3/uL (ref 4.0–10.5)

## 2014-12-16 LAB — LIPID PANEL
CHOLESTEROL: 188 mg/dL (ref 125–200)
HDL: 42 mg/dL (ref >=40)
LDL Cholesterol: 130 mg/dL — ABNORMAL HIGH (ref <130)
Total CHOL/HDL Ratio: 4.5 Ratio (ref <=5.0)
Triglycerides: 82 mg/dL (ref <150)
VLDL: 16 mg/dL (ref <30)

## 2014-12-16 MED ORDER — ZOSTER VACCINE LIVE 19400 UNT/0.65ML ~~LOC~~ SOLR: 0.6500 mL | Freq: Once | SUBCUTANEOUS | Status: DC

## 2014-12-16 MED ORDER — MELOXICAM 15 MG PO TABS: 15.0000 mg | ORAL_TABLET | Freq: Every day | ORAL | Status: DC

## 2014-12-16 NOTE — Progress Notes (Signed)
Subjective:    Patient ID: Gabriel Pittman, male    DOB: October 11, 1955, 59 y.o.   MRN: [id number]  HPI This is a 59 yo male who presents today for CPE. He is retired and is active in the community. His wife continues to work long hours, so he helps around the house with chores and cooking.   Last CPE-2007 PSA- never Colonoscopy- never Tdap- unknown Flu- annual Shingles- needs Dental- regular Eye- regular Exercise- cardio/strength 3-4x week.   Sees Dr. Conrad [place] (neruologist) for neck pain and has been contemplating surgery.  Sees Dr. [place] Crane for melanoma follow up  He had elevated blood pressure in the past and was on low dose metoprolol which he tolerated well. Has not had any recent elevated readings.   Past Medical History  Diagnosis Date  . Melanoma ([place]) 1990    RIGHT posterior shoulder   Past Surgical History  Procedure Laterality Date  . Melanoma excision Right 1990    RIGHT posterior shoulder  . Aorta surgery      congenital narrowing repair   Family History  Problem Relation Age of Onset  . Heart disease Mother   . Cancer Mother   . Heart disease Father   . Heart disease Sister   . Heart disease Brother    Social History  Substance Use Topics  . Smoking status: Current Every Day Smoker -- 20 years    Types: Cigarettes  . Smokeless tobacco: Not on file     Comment: trying to cut back and use E cigarettes  . Alcohol Use: No   Review of Systems  HENT: Positive for tinnitus (long standing, worked in [employer] for 30 years).   Eyes: Negative.   Respiratory: Negative.   Cardiovascular: Negative.   Gastrointestinal: Negative.   Endocrine: Negative.   Genitourinary: Negative.   Musculoskeletal: Positive for back pain, arthralgias and neck stiffness.       Long history of degeneration of spine, also has problems with sciatica.   Skin: Negative.   Allergic/Immunologic: Positive for environmental allergies.  Neurological: Positive for headaches.    Hematological: Negative.   Psychiatric/Behavioral: Negative.       Objective:   Physical Exam Physical Exam  Constitutional: He is oriented to person, place, and time. He appears well-developed and well-nourished.  HENT:  Head: Normocephalic and atraumatic.  Right Ear: External ear normal. Canal normal. TM normal. Left Ear: External ear normal. Canal normal. TM normal. Weber- no lateralization, Rhinne- AC>BC. Nose: Nose normal.  Mouth/Throat: Oropharynx is clear and moist.  Eyes: Conjunctivae are normal. Pupils are equal, round, and reactive to light.  Neck: Normal range of motion. Neck supple.  Cardiovascular: Normal rate, regular rhythm, normal heart sounds and intact distal pulses.   Pulmonary/Chest: Effort normal and breath sounds normal.  Abdominal: Soft. Bowel sounds are normal. Hernia confirmed negative in the right inguinal area and confirmed negative in the left inguinal area.  Genitourinary: Testes normal and penis normal. Circumcised.  Musculoskeletal: Normal range of motion. He exhibits no edema or tenderness.       Cervical back: Normal.       Thoracic back: Normal.       Lumbar back: Normal.  Lymphadenopathy:    He has no cervical adenopathy.       Right: No inguinal adenopathy present.       Left: No inguinal adenopathy present.  Neurological: He is alert and oriented to person, place, and time. He has normal reflexes.  Skin: Skin  is warm and dry.  Psychiatric: He has a normal mood and affect. His behavior is normal. Judgment normal.  Vitals reviewed. BP 168/72 mmHg  Pulse 60  Temp(Src) 98.3 F (36.8 C) (Oral)  Resp 16  Ht 5\' 9"  (1.753 m)  Wt 145 lb 3.2 oz (65.862 kg)  BMI 21.43 kg/m2 Wt Readings from Last 3 Encounters:  12/16/14 145 lb 3.2 oz (65.862 kg)  12/24/13 145 lb (65.772 kg)  11/10/13 144 lb 12.8 oz (65.681 kg)       Assessment & Plan:  1. Annual physical exam  2. Elevated blood pressure - CBC - Comprehensive metabolic panel - he will  check at home and keep a log, he will return in 2 weeks for blood pressure check  3. Tobacco use disorder - POCT urinalysis dipstick  4. Need for Tdap vaccination - Tdap vaccine greater than or equal to 7yo IM  5. Need for shingles vaccine - zoster vaccine live, PF, (ZOSTAVAX) 79150 UNT/0.65ML injection; Inject 19,400 Units into the skin once.  Dispense: 1 each; Refill: 0  6. Screening for prostate cancer - PSA  7. Screening for lipid disorders - Lipid panel  8. Encounter for vitamin deficiency screening - Vit D  25 hydroxy (rtn osteoporosis monitoring)  9. Screening for colon cancer - Ambulatory referral to Gastroenterology  Clarene Reamer, FNP-BC  [facility]  12/16/2014 3:24 PM

## 2014-12-16 NOTE — Telephone Encounter (Signed)
mobic 15mg 

## 2014-12-16 NOTE — Patient Instructions (Signed)
Please check your blood pressure at home a couple of times a week and keep a log Please come back in to the clinic for a blood pressure recheck in about 2 weeks How to Take Your Blood Pressure HOW DO I GET A BLOOD PRESSURE MACHINE?  You can buy an electronic home blood pressure machine at your local pharmacy. Insurance will sometimes cover the cost if you have a prescription.  Ask your doctor what type of machine is best for you. There are different machines for your arm and your wrist.  If you decide to buy a machine to check your blood pressure on your arm, first check the size of your arm so you can buy the right size cuff. To check the size of your arm:   Use a measuring tape that shows both inches and centimeters.   Wrap the measuring tape around the upper-middle part of your arm. You may need someone to help you measure.   Write down your arm measurement in both inches and centimeters.   To measure your blood pressure correctly, it is important to have the right size cuff.   If your arm is up to 13 inches (up to 34 centimeters), get an adult cuff size.  If your arm is 13 to 17 inches (35 to 44 centimeters), get a large adult cuff size.    If your arm is 17 to 20 inches (45 to 52 centimeters), get an adult thigh cuff.  WHAT DO THE NUMBERS MEAN?   There are two numbers that make up your blood pressure. For example: 120/80.  The first number (120 in our example) is called the "systolic pressure." It is a measure of the pressure in your blood vessels when your heart is pumping blood.  The second number (80 in our example) is called the "diastolic pressure." It is a measure of the pressure in your blood vessels when your heart is resting between beats.  Your doctor will tell you what your blood pressure should be. WHAT SHOULD I DO BEFORE I CHECK MY BLOOD PRESSURE?   Try to rest or relax for at least 30 minutes before you check your blood pressure.  Do not smoke.  Do not  have any drinks with caffeine, such as:  Soda.  Coffee.  Tea.  Check your blood pressure in a quiet room.  Sit down and stretch out your arm on a table. Keep your arm at about the level of your heart. Let your arm relax.  Make sure that your legs are not crossed. HOW DO I CHECK MY BLOOD PRESSURE?  Follow the directions that came with your machine.  Make sure you remove any tight-fitting clothing from your arm or wrist. Wrap the cuff around your upper arm or wrist. You should be able to fit a finger between the cuff and your arm. If you cannot fit a finger between the cuff and your arm, it is too tight and should be removed and rewrapped.  Some units require you to manually pump up the arm cuff.  Automatic units inflate the cuff when you press a button.  Cuff deflation is automatic in both models.  After the cuff is inflated, the unit measures your blood pressure and pulse. The readings are shown on a monitor. Hold still and breathe normally while the cuff is inflated.  Getting a reading takes less than a minute.  Some models store readings in a memory. Some provide a printout of readings. If your machine  does not store your readings, keep a written record.  Take readings with you to your next visit with your doctor.   This information is not intended to replace advice given to you by your health care provider. Make sure you discuss any questions you have with your health care provider.   Document Released: 02/10/2008 Document Revised: 03/20/2014 Document Reviewed: 04/24/2013 Elsevier Interactive Patient Education [employer].

## 2014-12-16 NOTE — Telephone Encounter (Addendum)
Pt states the prescription was not called in on 12/15/2014 by Dr. Paulla Dolly.  Dr. Mellody Drown orders called to pt and pharmacy.

## 2014-12-17 ENCOUNTER — Other Ambulatory Visit: Payer: Self-pay | Admitting: Family Medicine

## 2014-12-17 DIAGNOSIS — R319 Hematuria, unspecified: Secondary | ICD-10-CM

## 2014-12-17 LAB — VITAMIN D 25 HYDROXY (VIT D DEFICIENCY, FRACTURES): Vit D, 25-Hydroxy: 44 ng/mL (ref 30–100)

## 2014-12-17 LAB — PSA: PSA: 0.6 ng/mL (ref <=4.00)

## 2014-12-17 NOTE — Progress Notes (Signed)
Subjective:     Patient ID: Gabriel Pittman, Gabriel   DOB: 05-19-1955, 59 y.o.   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 If symptoms persist we may need to get an MRI of this to rule out tendon dysfunction or tear

## 2014-12-21 ENCOUNTER — Ambulatory Visit (AMBULATORY_SURGERY_CENTER): Payer: Self-pay

## 2014-12-21 VITALS — Ht 69.0 in | Wt 150.0 lb

## 2014-12-21 DIAGNOSIS — Z1211 Encounter for screening for malignant neoplasm of colon: Secondary | ICD-10-CM

## 2014-12-21 MED ORDER — PEG-KCL-NACL-NASULF-NA ASC-C 100 G PO SOLR: 1.0000 | Freq: Once | ORAL | Status: DC

## 2014-12-21 NOTE — Progress Notes (Signed)
No egg or soy allergies Not on home 02 No previous anesthesia complications Emmi video sent to [email or web address] No diet or weight loss meds

## 2014-12-28 ENCOUNTER — Encounter: Payer: Self-pay | Admitting: Podiatry

## 2014-12-28 ENCOUNTER — Ambulatory Visit (INDEPENDENT_AMBULATORY_CARE_PROVIDER_SITE_OTHER): Payer: BLUE CROSS/BLUE SHIELD | Admitting: Podiatry

## 2014-12-28 VITALS — BP 164/77 | HR 60 | Resp 16

## 2014-12-28 DIAGNOSIS — M779 Enthesopathy, unspecified: Secondary | ICD-10-CM | POA: Diagnosis not present

## 2014-12-28 DIAGNOSIS — M722 Plantar fascial fibromatosis: Secondary | ICD-10-CM | POA: Diagnosis not present

## 2014-12-29 NOTE — Progress Notes (Signed)
Subjective:     Patient ID: Gabriel Pittman, male   DOB: 18-Jan-1956, 59 y.o.   MRN: [id number]  HPI patient presents stating it seems better recently but I have not been active because I've had a chest cold. Patient states that he has not been walking and is concerned what will happen   Review of Systems     Objective:   Physical Exam Neurovascular unchanged with discomfort still noted around the posterior tibial tendon right as it comes under the medial malleolus. Moderate depression of the arch is noted bilateral    Assessment:     Tendinitis with possibility for posterior tibial tendon tear due to the continued symptoms he experiences    Plan:     Instructed on usage of fascial brace and also continued orthotic usage and discussed possibly making him a new orthotic. At this time if it were to persist or get worse work and the need to get a MRI to rule out tear of the tendon

## 2015-01-05 ENCOUNTER — Encounter: Payer: Self-pay | Admitting: *Deleted

## 2015-01-05 ENCOUNTER — Encounter: Payer: Self-pay | Admitting: Gastroenterology

## 2015-01-05 ENCOUNTER — Ambulatory Visit (AMBULATORY_SURGERY_CENTER): Payer: BLUE CROSS/BLUE SHIELD | Admitting: Gastroenterology

## 2015-01-05 VITALS — BP 145/72 | HR 56 | Temp 96.0°F | Resp 26 | Ht 69.0 in | Wt 150.0 lb

## 2015-01-05 DIAGNOSIS — K621 Rectal polyp: Secondary | ICD-10-CM | POA: Diagnosis not present

## 2015-01-05 DIAGNOSIS — Z1211 Encounter for screening for malignant neoplasm of colon: Secondary | ICD-10-CM | POA: Diagnosis not present

## 2015-01-05 DIAGNOSIS — D123 Benign neoplasm of transverse colon: Secondary | ICD-10-CM

## 2015-01-05 DIAGNOSIS — D125 Benign neoplasm of sigmoid colon: Secondary | ICD-10-CM

## 2015-01-05 DIAGNOSIS — D129 Benign neoplasm of anus and anal canal: Secondary | ICD-10-CM

## 2015-01-05 DIAGNOSIS — D128 Benign neoplasm of rectum: Secondary | ICD-10-CM

## 2015-01-05 MED ORDER — SODIUM CHLORIDE 0.9 % IV SOLN: 500.0000 mL | INTRAVENOUS | Status: DC

## 2015-01-05 NOTE — Telephone Encounter (Signed)
Erroneous encounter

## 2015-01-05 NOTE — Patient Instructions (Signed)
YOU HAD AN ENDOSCOPIC PROCEDURE TODAY AT THE [facility]:   Refer to the procedure report that was given to you for any specific questions about what was found during the examination.  If the procedure report does not answer your questions, please call your gastroenterologist to clarify.  If you requested that your care partner not be given the details of your procedure findings, then the procedure report has been included in a sealed envelope for you to review at your convenience later.  YOU SHOULD EXPECT: Some feelings of bloating in the abdomen. Passage of more gas than usual.  Walking can help get rid of the air that was put into your GI tract during the procedure and reduce the bloating. If you had a lower endoscopy (such as a colonoscopy or flexible sigmoidoscopy) you may notice spotting of blood in your stool or on the toilet paper. If you underwent a bowel prep for your procedure, you may not have a normal bowel movement for a few days.  Please Note:  You might notice some irritation and congestion in your nose or some drainage.  This is from the oxygen used during your procedure.  There is no need for concern and it should clear up in a day or so.  SYMPTOMS TO REPORT IMMEDIATELY:   Following lower endoscopy (colonoscopy or flexible sigmoidoscopy):  Excessive amounts of blood in the stool  Significant tenderness or worsening of abdominal pains  Swelling of the abdomen that is new, acute  Fever of 100F or higher   For urgent or emergent issues, a gastroenterologist can be reached at any hour by calling [phone].   DIET: Your first meal following the procedure should be a small meal and then it is ok to progress to your normal diet. Heavy or fried foods are harder to digest and may make you feel nauseous or bloated.  Likewise, meals heavy in dairy and vegetables can increase bloating.  Drink plenty of fluids but you should avoid alcoholic beverages for 24  hours.  ACTIVITY:  You should plan to take it easy for the rest of today and you should NOT DRIVE or use heavy machinery until tomorrow (because of the sedation medicines used during the test).    FOLLOW UP: Our staff will call the number listed on your records the next business day following your procedure to check on you and address any questions or concerns that you may have regarding the information given to you following your procedure. If we do not reach you, we will leave a message.  However, if you are feeling well and you are not experiencing any problems, there is no need to return our call.  We will assume that you have returned to your regular daily activities without incident.  If any biopsies were taken you will be contacted by phone or by letter within the next 1-3 weeks.  Please call us at [phone] if you have not heard about the biopsies in 3 weeks.    SIGNATURES/CONFIDENTIALITY: You and/or your care partner have signed paperwork which will be entered into your electronic medical record.  These signatures attest to the fact that that the information above on your After Visit Summary has been reviewed and is understood.  Full responsibility of the confidentiality of this discharge information lies with you and/or your care-partner.  Polyp information given.  

## 2015-01-05 NOTE — Progress Notes (Signed)
Transferred to recovery room. A/O x3, pleased with MAC.  VSS.  Report to Jane, RN. 

## 2015-01-05 NOTE — Op Note (Signed)
[place]  [address] [place], [address]   COLONOSCOPY PROCEDURE REPORT  PATIENT: Gabriel Pittman, Gabriel Pittman  MR#: [id number] BIRTHDATE: 12-30-1955 , 59  yrs. old GENDER: male ENDOSCOPIST: Milus Banister, MD PROCEDURE DATE:  01/05/2015 PROCEDURE:   Colonoscopy, screening and Colonoscopy with snare polypectomy First Screening Colonoscopy - Avg.  risk and is 50 yrs.  old or older Yes.  Prior Negative Screening - Now for repeat screening. N/A  History of Adenoma - Now for follow-up colonoscopy & has been > or = to 3 yrs.  N/A  Polyps removed today? Yes ASA CLASS:   Class II INDICATIONS:Screening for colonic neoplasia and Colorectal Neoplasm Risk Assessment for this procedure is average risk. MEDICATIONS: Monitored anesthesia care and Propofol 300 mg IV  DESCRIPTION OF PROCEDURE:   After the risks benefits and alternatives of the procedure were thoroughly explained, informed consent was obtained.  The digital rectal exam revealed no abnormalities of the rectum.   The LB [id number]  endoscope was introduced through the anus and advanced to the cecum, which was identified by both the appendix and ileocecal valve. No adverse events experienced.   The quality of the prep was excellent.  The instrument was then slowly withdrawn as the colon was fully examined. Estimated blood loss is zero unless otherwise noted in this procedure report.   COLON FINDINGS: Three sessile polyps ranging between 3-59mm in size were found in the rectum, sigmoid colon, and transverse colon. Polypectomies were performed with a cold snare.  The resection was complete, the polyp tissue was completely retrieved and sent to histology.   The examination was otherwise normal.  Retroflexed views revealed no abnormalities. The time to cecum = 2.3 Withdrawal time = 12.0   The scope was withdrawn and the procedure completed. COMPLICATIONS: There were no immediate complications.  ENDOSCOPIC  IMPRESSION: 1.   Three sessile polyps ranging between 3-31mm in size were found in the rectum, sigmoid colon, and transverse colon; polypectomies were performed with a cold snare 2.   The examination was otherwise normal  RECOMMENDATIONS: If the polyp(s) removed today are proven to be adenomatous (pre-cancerous) polyps, you will need a colonoscopy in 3-5 years. Otherwise you should continue to follow colorectal cancer screening guidelines for "routine risk" patients with a colonoscopy in 10 years.  You will receive a letter within 1-2 weeks with the results of your biopsy as well as final recommendations.  Please call my office if you have not received a letter after 3 weeks.  eSigned:  Milus Banister, MD 01/05/2015 11:37 AM

## 2015-01-05 NOTE — Progress Notes (Signed)
Called to room to assist during endoscopic procedure.  Patient ID and intended procedure confirmed with present staff. Received instructions for my participation in the procedure from the performing physician.  

## 2015-01-06 ENCOUNTER — Telehealth: Payer: Self-pay | Admitting: *Deleted

## 2015-01-06 NOTE — Telephone Encounter (Signed)
No answer, left message to call if questions or concerns. 

## 2015-01-08 ENCOUNTER — Other Ambulatory Visit (INDEPENDENT_AMBULATORY_CARE_PROVIDER_SITE_OTHER): Payer: BLUE CROSS/BLUE SHIELD | Admitting: *Deleted

## 2015-01-08 DIAGNOSIS — Z0000 Encounter for general adult medical examination without abnormal findings: Secondary | ICD-10-CM | POA: Diagnosis not present

## 2015-01-08 LAB — POCT URINALYSIS DIP (MANUAL ENTRY)
Bilirubin, UA: NEGATIVE
Glucose, UA: NEGATIVE
Ketones, POC UA: NEGATIVE
Leukocytes, UA: NEGATIVE
NITRITE UA: NEGATIVE
PROTEIN UA: NEGATIVE
SPEC GRAV UA: 1.015
UROBILINOGEN UA: 0.2
pH, UA: 7

## 2015-01-08 LAB — POC MICROSCOPIC URINALYSIS (UMFC): MUCUS RE: ABSENT

## 2015-01-14 ENCOUNTER — Encounter: Payer: Self-pay | Admitting: Gastroenterology

## 2015-02-19 ENCOUNTER — Telehealth: Payer: Self-pay | Admitting: *Deleted

## 2015-02-19 ENCOUNTER — Encounter: Payer: Self-pay | Admitting: Podiatry

## 2015-02-19 ENCOUNTER — Ambulatory Visit (INDEPENDENT_AMBULATORY_CARE_PROVIDER_SITE_OTHER): Payer: BLUE CROSS/BLUE SHIELD | Admitting: Podiatry

## 2015-02-19 VITALS — BP 165/85 | HR 65 | Resp 16

## 2015-02-19 DIAGNOSIS — M779 Enthesopathy, unspecified: Secondary | ICD-10-CM | POA: Diagnosis not present

## 2015-02-19 MED ORDER — TRIAMCINOLONE ACETONIDE 10 MG/ML IJ SUSP
10.0000 mg | Freq: Once | INTRAMUSCULAR | Status: AC
Start: 2015-02-19 — End: 2015-02-19
  Administered 2015-02-19: 10 mg

## 2015-02-19 MED ORDER — NONFORMULARY OR COMPOUNDED ITEM: Status: DC

## 2015-02-19 NOTE — Telephone Encounter (Signed)
Dr. Paulla Dolly ordered Antiinflammatory Cream from [facility].

## 2015-02-21 NOTE — Progress Notes (Signed)
Subjective:     Patient ID: REGIONAL HOVAN, male   DOB: February 18, 1956, 59 y.o.   MRN: [id number]  HPI patient states I seem some improved but continue to have discomfort with excessive activity and it is in a slightly different place   Review of Systems     Objective:   Physical Exam Neurovascular status intact muscle strength adequate with patient having discomfort not currently in the posterior tibial tendon but slightly more plantar and posterior to this area with no indications of nerve-like discomfort    Assessment:     Inflammatory tendinitis or ligament inflammation occurring    Plan:     Did careful reinjection advised on continued orthotic usage heat and ice therapy and not doing excessive activity. Reappoint for [place] to recheck

## 2015-05-25 ENCOUNTER — Other Ambulatory Visit: Payer: Self-pay | Admitting: Neurology

## 2015-05-25 DIAGNOSIS — I6523 Occlusion and stenosis of bilateral carotid arteries: Secondary | ICD-10-CM

## 2015-06-04 ENCOUNTER — Ambulatory Visit
Admission: RE | Admit: 2015-06-04 | Discharge: 2015-06-04 | Disposition: A | Discharge status: Home / Self Care | Payer: BLUE CROSS/BLUE SHIELD | Source: Ambulatory Visit | Attending: Neurology | Admitting: Neurology

## 2015-06-04 DIAGNOSIS — I6523 Occlusion and stenosis of bilateral carotid arteries: Secondary | ICD-10-CM

## 2015-06-04 MED ORDER — IOPAMIDOL (ISOVUE-370) INJECTION 76%
100.0000 mL | Freq: Once | INTRAVENOUS | Status: AC | PRN
  Administered 2015-06-04: 100 mL via INTRAVENOUS

## 2015-06-15 ENCOUNTER — Ambulatory Visit (INDEPENDENT_AMBULATORY_CARE_PROVIDER_SITE_OTHER): Payer: BLUE CROSS/BLUE SHIELD | Admitting: Family Medicine

## 2015-06-15 ENCOUNTER — Encounter: Payer: Self-pay | Admitting: Family Medicine

## 2015-06-15 VITALS — BP 135/74 | HR 58 | Temp 98.1°F | Resp 16 | Ht 69.0 in | Wt 155.0 lb

## 2015-06-15 DIAGNOSIS — I6523 Occlusion and stenosis of bilateral carotid arteries: Secondary | ICD-10-CM | POA: Diagnosis not present

## 2015-06-15 DIAGNOSIS — E7800 Pure hypercholesterolemia, unspecified: Secondary | ICD-10-CM | POA: Diagnosis not present

## 2015-06-15 DIAGNOSIS — Z1159 Encounter for screening for other viral diseases: Secondary | ICD-10-CM | POA: Diagnosis not present

## 2015-06-15 LAB — LIPID PANEL
CHOLESTEROL: 211 mg/dL — AB (ref 125–200)
HDL: 50 mg/dL (ref >=40)
LDL Cholesterol: 148 mg/dL — ABNORMAL HIGH (ref <130)
Total CHOL/HDL Ratio: 4.2 Ratio (ref <=5.0)
Triglycerides: 65 mg/dL (ref <150)
VLDL: 13 mg/dL (ref <30)

## 2015-06-15 MED ORDER — LISINOPRIL 5 MG PO TABS: 5.0000 mg | ORAL_TABLET | Freq: Every day | ORAL | Status: DC

## 2015-06-15 NOTE — Progress Notes (Signed)
   Subjective:    Patient ID: Gabriel Pittman, male    DOB: Aug 07, 1955, 60 y.o.   MRN: [id number]  HPI This is a pleasant 60 yo male who presents today for follow up HTN He has been doing well other than undergoing recent dental procedures. He is currently undergoing laser treatments to his gums for gum disease. He is on antibiotics.   He is followed for carotid artery stenosis. His neurologist (Dr. Melton Alar) is requesting that the patient's blood pressure and lipid panel be aggressively controlled. The patient stopped smoking mid Jan.   Past Medical History  Diagnosis Date  . Melanoma ([place]) 1990    RIGHT posterior shoulder  . Allergy   . Arthritis    Past Surgical History  Procedure Laterality Date  . Melanoma excision Right 1990    RIGHT posterior shoulder  . Aorta surgery      congenital narrowing repair   Family History  Problem Relation Age of Onset  . Heart disease Mother   . Heart disease Father   . Cancer Sister     per patient of the bone  . Heart disease Brother   . Colon cancer Neg Hx    Social History  Substance Use Topics  . Smoking status: Current Every Day Smoker -- 0.50 packs/day for 20 years    Types: Cigarettes  . Smokeless tobacco: Current User     Comment: trying to cut back and use E cigarettes  . Alcohol Use: No     Review of Systems No chest pain, no SOB, no edema    Objective:   Physical Exam Physical Exam  Constitutional: Oriented to person, place, and time. He appears well-developed and well-nourished.  HENT:  Head: Normocephalic and atraumatic.  Eyes: Conjunctivae are normal.  Neck: Normal range of motion. Neck supple.  Cardiovascular: Normal rate, regular rhythm and normal heart sounds.   Pulmonary/Chest: Effort normal and breath sounds normal.  Musculoskeletal: Normal range of motion.  Neurological: Alert and oriented to person, place, and time.  Skin: Skin is warm and dry.  Psychiatric: Normal mood and affect. Behavior is normal.  Judgment and thought content normal.  Vitals reviewed.  BP 135/74 mmHg  Pulse 58  Temp(Src) 98.1 F (36.7 C)  Resp 16  Ht 5\' 9"  (1.753 m)  Wt 155 lb (70.308 kg)  BMI 22.88 kg/m2 Wt Readings from Last 3 Encounters:  06/15/15 155 lb (70.308 kg)  01/05/15 150 lb (68.04 kg)  12/21/14 150 lb (68.04 kg)       Assessment & Plan:  1. Elevated LDL cholesterol level - Lipid panel - will await results prior to starting statin  2. Carotid stenosis, bilateral - lisinopril (PRINIVIL,ZESTRIL) 5 MG tablet; Take 1 tablet (5 mg total) by mouth daily.  Dispense: 90 tablet; Refill: 3 - he has a blood pressure cuff at home and will check pressures and report any side effects  3. Need for hepatitis C screening test - Hepatitis C antibody  - follow up in 6 months   Clarene Reamer, FNP-BC  [facility]  06/16/2015 9:41 PM

## 2015-06-15 NOTE — Patient Instructions (Addendum)
Please check your blood pressure twice a week Please let me know if you have any side effects Goal of <130/80  How to Take Your Blood Pressure HOW DO I GET A BLOOD PRESSURE MACHINE?  You can buy an electronic home blood pressure machine at your local pharmacy. Insurance will sometimes cover the cost if you have a prescription.  Ask your doctor what type of machine is best for you. There are different machines for your arm and your wrist.  If you decide to buy a machine to check your blood pressure on your arm, first check the size of your arm so you can buy the right size cuff. To check the size of your arm:   Use a measuring tape that shows both inches and centimeters.   Wrap the measuring tape around the upper-middle part of your arm. You may need someone to help you measure.   Write down your arm measurement in both inches and centimeters.   To measure your blood pressure correctly, it is important to have the right size cuff.   If your arm is up to 13 inches (up to 34 centimeters), get an adult cuff size.  If your arm is 13 to 17 inches (35 to 44 centimeters), get a large adult cuff size.    If your arm is 17 to 20 inches (45 to 52 centimeters), get an adult thigh cuff.  WHAT DO THE NUMBERS MEAN?   There are two numbers that make up your blood pressure. For example: 120/80.  The first number (120 in our example) is called the "systolic pressure." It is a measure of the pressure in your blood vessels when your heart is pumping blood.  The second number (80 in our example) is called the "diastolic pressure." It is a measure of the pressure in your blood vessels when your heart is resting between beats.  Your doctor will tell you what your blood pressure should be. WHAT SHOULD I DO BEFORE I CHECK MY BLOOD PRESSURE?   Try to rest or relax for at least 30 minutes before you check your blood pressure.  Do not smoke.  Do not have any drinks with caffeine, such  as:  Soda.  Coffee.  Tea.  Check your blood pressure in a quiet room.  Sit down and stretch out your arm on a table. Keep your arm at about the level of your heart. Let your arm relax.  Make sure that your legs are not crossed. HOW DO I CHECK MY BLOOD PRESSURE?  Follow the directions that came with your machine.  Make sure you remove any tight-fitting clothing from your arm or wrist. Wrap the cuff around your upper arm or wrist. You should be able to fit a finger between the cuff and your arm. If you cannot fit a finger between the cuff and your arm, it is too tight and should be removed and rewrapped.  Some units require you to manually pump up the arm cuff.  Automatic units inflate the cuff when you press a button.  Cuff deflation is automatic in both models.  After the cuff is inflated, the unit measures your blood pressure and pulse. The readings are shown on a monitor. Hold still and breathe normally while the cuff is inflated.  Getting a reading takes less than a minute.  Some models store readings in a memory. Some provide a printout of readings. If your machine does not store your readings, keep a written record.  Take  readings with you to your next visit with your doctor.   This information is not intended to replace advice given to you by your health care provider. Make sure you discuss any questions you have with your health care provider.   Document Released: 02/10/2008 Document Revised: 03/20/2014 Document Reviewed: 04/24/2013 Elsevier Interactive Patient Education 2016 [employer].     IF you received an x-ray today, you will receive an invoice from [facility]. Please contact [facility] at [phone] with questions or concerns regarding your invoice.   IF you received labwork today, you will receive an invoice from [employer]. Please contact [facility] at [phone] with questions or concerns regarding your  invoice.   Our billing staff will not be able to assist you with questions regarding bills from these companies.  You will be contacted with the lab results as soon as they are available. The fastest way to get your results is to activate your My Chart account. Instructions are located on the last page of this paperwork. If you have not heard from [place] regarding the results in 2 weeks, please contact this office.

## 2015-06-16 LAB — HEPATITIS C ANTIBODY: HCV Ab: NEGATIVE

## 2015-06-17 ENCOUNTER — Encounter: Payer: Self-pay | Admitting: Family Medicine

## 2015-06-17 DIAGNOSIS — I6523 Occlusion and stenosis of bilateral carotid arteries: Secondary | ICD-10-CM | POA: Insufficient documentation

## 2015-06-17 MED ORDER — ATORVASTATIN CALCIUM 20 MG PO TABS: 20.0000 mg | ORAL_TABLET | Freq: Every day | ORAL | Status: DC

## 2015-06-17 NOTE — Addendum Note (Signed)
Addended by: Clarene Reamer B on: 06/17/2015 09:43 AM   Modules accepted: Orders, Medications, SmartSet

## 2015-12-20 ENCOUNTER — Ambulatory Visit (INDEPENDENT_AMBULATORY_CARE_PROVIDER_SITE_OTHER): Payer: BLUE CROSS/BLUE SHIELD | Admitting: Physician Assistant

## 2015-12-20 VITALS — BP 132/60 | HR 67 | Temp 98.9°F | Resp 16 | Ht 69.0 in | Wt 156.8 lb

## 2015-12-20 DIAGNOSIS — E7800 Pure hypercholesterolemia, unspecified: Secondary | ICD-10-CM | POA: Diagnosis not present

## 2015-12-20 DIAGNOSIS — I10 Essential (primary) hypertension: Secondary | ICD-10-CM

## 2015-12-20 DIAGNOSIS — I6523 Occlusion and stenosis of bilateral carotid arteries: Secondary | ICD-10-CM

## 2015-12-20 MED ORDER — LISINOPRIL 5 MG PO TABS: 5.0000 mg | ORAL_TABLET | Freq: Every day | ORAL | 3 refills | Status: DC

## 2015-12-20 MED ORDER — ATORVASTATIN CALCIUM 20 MG PO TABS: 20.0000 mg | ORAL_TABLET | Freq: Every day | ORAL | 3 refills | Status: DC

## 2015-12-20 NOTE — Progress Notes (Signed)
Gabriel Pittman  MRN: [id number] DOB: [birth year]  PCP: Edison Nasuti, MD  Subjective:  Pt is a pleasant 60 year old male, history of hypertension, elevated LDL, and carotid artery stenosis, who presents to clinic for 57-month follow-up mediations. He was seen here six months ago by Clarene Reamer and started on Atorvistatin 20mg  and Lisinopril 5mg .   Checks blood pressures at home, states they are in the 130's / 90's.  Blood pressure today at [facility] is 132/60.   Reports dry cough in the morning, "but it's getting better". Cough started after he initiated the Lisinopril. He known cough is a side effect of this medication, however is not interested in changing medications. Notes the cough "isn't that bad".    Denies headache, light-headedness, chest pain, syncope, abdominal pain.  Quit smoking 9 months ago. Was smoking a pack a day.   Review of Systems  Respiratory: Positive for cough. Negative for chest tightness and shortness of breath.   Cardiovascular: Negative for chest pain and palpitations.  Gastrointestinal: Negative for abdominal pain, constipation, diarrhea, nausea and vomiting.  Neurological: Negative for light-headedness and headaches.    Patient Active Problem List   Diagnosis Date Noted  . Bilateral carotid artery stenosis 06/17/2015  . History of melanoma excision 11/10/2013  . S/P surgery for complex congenital heart disease 07/01/2011    Current Outpatient Prescriptions on File Prior to Visit  Medication Sig Dispense Refill  . aspirin 325 MG tablet Take 166 mg by mouth daily.     Marland Kitchen atorvastatin (LIPITOR) 20 MG tablet Take 1 tablet (20 mg total) by mouth daily. 90 tablet 3  . lisinopril (PRINIVIL,ZESTRIL) 5 MG tablet Take 1 tablet (5 mg total) by mouth daily. 90 tablet 3  . Loratadine (CLARITIN PO) Take 10 mg by mouth daily.    . NONFORMULARY OR COMPOUNDED [place] compound:  Antiiflammatory Cream - Diclofenac 3%, Baclofen 2%, Cyclobenzaprine 2%,  Lidocaine 2% dispense 180grams apply 1-2 grams to affected area 3-4 times a day, +5refills. 180 each 5   No current facility-administered medications on file prior to visit.     Allergies  Allergen Reactions  . Levofloxacin Hives    Objective:  BP 132/60 (BP Location: Right Arm, Patient Position: Sitting, Cuff Size: Normal)   Pulse 67   Temp 98.9 F (37.2 C) (Oral)   Resp 16   Ht 5\' 9"  (1.753 m)   Wt 156 lb 12.8 oz (71.1 kg)   SpO2 97%   BMI 23.16 kg/m   Physical Exam  Constitutional: He is oriented to person, place, and time and well-developed, well-nourished, and in no distress. No distress.  Cardiovascular: Normal rate, regular rhythm and normal heart sounds.   Pulmonary/Chest: Effort normal. No respiratory distress.  Neurological: He is alert and oriented to person, place, and time. GCS score is 15.  Skin: Skin is warm and dry.  Psychiatric: Mood, memory, affect and judgment normal.  Vitals reviewed.   Assessment and Plan :  1. Essential Hypertension 2. Elevated LDL cholesterol level 3. Carotid stenosis, bilateral - Lipid panel; Future.  Patient will come back for a lab-draw-only in a few days when he is fasting. Will assess any medication dose changes when results come back.  - Patient is doing well and tolerating new medications well. Reports cough in the morning, however he is not interested in changing blood pressure medication. He understands this is more than likely a side-effect from Lisinopril, however he would like to continue with this course  of therapy.  - atorvastatin (LIPITOR) 20 MG tablet; Take 1 tablet (20 mg total) by mouth daily.  Dispense: 90 tablet; Refill: 3 - lisinopril (PRINIVIL,ZESTRIL) 5 MG tablet; Take 1 tablet (5 mg total) by mouth daily.  Dispense: 90 tablet; Refill: 3   Mercer Pod, PA-C  [facility] 12/20/2015 2:53 PM

## 2015-12-20 NOTE — Patient Instructions (Signed)
     IF you received an x-ray today, you will receive an invoice from [facility]. Please contact [facility] at [phone] with questions or concerns regarding your invoice.   IF you received labwork today, you will receive an invoice from [facility]. Please contact [facility] at [phone] with questions or concerns regarding your invoice.   Our billing staff will not be able to assist you with questions regarding bills from these companies.  You will be contacted with the lab results as soon as they are available. The fastest way to get your results is to activate your My Chart account. Instructions are located on the last page of this paperwork. If you have not heard from us regarding the results in 2 weeks, please contact this office.      

## 2015-12-21 ENCOUNTER — Other Ambulatory Visit (INDEPENDENT_AMBULATORY_CARE_PROVIDER_SITE_OTHER): Payer: BLUE CROSS/BLUE SHIELD | Admitting: Physician Assistant

## 2015-12-21 DIAGNOSIS — I10 Essential (primary) hypertension: Secondary | ICD-10-CM

## 2015-12-21 LAB — LIPID PANEL
Cholesterol: 148 mg/dL (ref 125–200)
HDL: 54 mg/dL (ref >=40)
LDL Cholesterol: 83 mg/dL (ref <130)
Total CHOL/HDL Ratio: 2.7 Ratio (ref <=5.0)
Triglycerides: 55 mg/dL (ref <150)
VLDL: 11 mg/dL (ref <30)

## 2015-12-23 ENCOUNTER — Encounter: Payer: Self-pay | Admitting: Physician Assistant

## 2015-12-23 ENCOUNTER — Ambulatory Visit (INDEPENDENT_AMBULATORY_CARE_PROVIDER_SITE_OTHER): Payer: BLUE CROSS/BLUE SHIELD

## 2015-12-23 ENCOUNTER — Ambulatory Visit (INDEPENDENT_AMBULATORY_CARE_PROVIDER_SITE_OTHER): Payer: BLUE CROSS/BLUE SHIELD | Admitting: Podiatry

## 2015-12-23 ENCOUNTER — Encounter: Payer: Self-pay | Admitting: Podiatry

## 2015-12-23 DIAGNOSIS — M79671 Pain in right foot: Secondary | ICD-10-CM

## 2015-12-23 DIAGNOSIS — G5751 Tarsal tunnel syndrome, right lower limb: Secondary | ICD-10-CM

## 2015-12-23 NOTE — Progress Notes (Signed)
Subjective:     Patient ID: Gabriel Pittman, male   DOB: 1955-05-27, 60 y.o.   MRN: [id number]  HPI patient states I'm still getting pain in my right foot and it's really more burning and tingling and it seems to come from the inside of the right foot. The tendon seems to be functioning better and I'm able to get around but if I'm on my foot for to long but time I get this burning shooting pain that makes me walk differently   Review of Systems     Objective:   Physical Exam Neurovascular status intact posterior tibial tendon is functioning normal and no discomfort in the plantar fascia. 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 I do think given the long-term chronic nature of symptoms for her to respond to previous injections immobilization and reduced activity that release of the tarsal canal would be his best opportunity for long-term improvement. I did explain there is absolutely no long-term guarantees this would solve his problem and he understands that and due to my schedule and the fact that he needs this done and a fairly short period of time I will refer him to one of the physicians in the group for this procedure

## 2015-12-23 NOTE — Addendum Note (Signed)
Addended by: Dorise Hiss on: 12/23/2015 07:15 AM   Modules accepted: Orders

## 2015-12-23 NOTE — Progress Notes (Signed)
Letter mailed to patient with lab results. No need for medication changes at this time. He is to continue current therapy.  Encouraged patient to make an appointment for annual physical in six months, put in a future order for fasting lipid level for him to have done before that appointment.

## 2015-12-23 NOTE — Patient Instructions (Signed)

## 2016-01-03 ENCOUNTER — Ambulatory Visit: Payer: BLUE CROSS/BLUE SHIELD | Admitting: Podiatry

## 2016-06-05 ENCOUNTER — Other Ambulatory Visit: Payer: Self-pay | Admitting: Physician Assistant

## 2016-06-05 ENCOUNTER — Other Ambulatory Visit: Payer: Self-pay

## 2016-06-05 DIAGNOSIS — E7800 Pure hypercholesterolemia, unspecified: Secondary | ICD-10-CM

## 2016-06-05 DIAGNOSIS — I6523 Occlusion and stenosis of bilateral carotid arteries: Secondary | ICD-10-CM

## 2016-06-05 MED ORDER — ATORVASTATIN CALCIUM 20 MG PO TABS: 20.0000 mg | ORAL_TABLET | Freq: Every day | ORAL | 0 refills | Status: DC

## 2016-07-26 ENCOUNTER — Encounter: Payer: Self-pay | Admitting: Physician Assistant

## 2016-07-26 ENCOUNTER — Ambulatory Visit (INDEPENDENT_AMBULATORY_CARE_PROVIDER_SITE_OTHER): Payer: BLUE CROSS/BLUE SHIELD | Admitting: Physician Assistant

## 2016-07-26 VITALS — BP 134/77 | HR 56 | Temp 98.1°F | Resp 18 | Ht 69.0 in | Wt 157.2 lb

## 2016-07-26 DIAGNOSIS — Z131 Encounter for screening for diabetes mellitus: Secondary | ICD-10-CM

## 2016-07-26 DIAGNOSIS — Z87448 Personal history of other diseases of urinary system: Secondary | ICD-10-CM | POA: Diagnosis not present

## 2016-07-26 DIAGNOSIS — Z720 Tobacco use: Secondary | ICD-10-CM

## 2016-07-26 DIAGNOSIS — Z0000 Encounter for general adult medical examination without abnormal findings: Secondary | ICD-10-CM | POA: Diagnosis not present

## 2016-07-26 DIAGNOSIS — Z130 Encounter for screening for diseases of the blood and blood-forming organs and certain disorders involving the immune mechanism: Secondary | ICD-10-CM

## 2016-07-26 DIAGNOSIS — I6523 Occlusion and stenosis of bilateral carotid arteries: Secondary | ICD-10-CM

## 2016-07-26 DIAGNOSIS — L249 Irritant contact dermatitis, unspecified cause: Secondary | ICD-10-CM

## 2016-07-26 DIAGNOSIS — E785 Hyperlipidemia, unspecified: Secondary | ICD-10-CM

## 2016-07-26 LAB — CBC WITH DIFFERENTIAL/PLATELET
Basophils Absolute: 0 10*3/uL (ref 0.0–0.2)
Basos: 1 %
EOS (ABSOLUTE): 0.2 10*3/uL (ref 0.0–0.4)
Eos: 2 %
Hematocrit: 38.7 % (ref 37.5–51.0)
Hemoglobin: 12.8 g/dL — ABNORMAL LOW (ref 13.0–17.7)
Immature Grans (Abs): 0 10*3/uL (ref 0.0–0.1)
Immature Granulocytes: 0 %
Lymphocytes Absolute: 1.9 10*3/uL (ref 0.7–3.1)
Lymphs: 31 %
MCH: 32 pg (ref 26.6–33.0)
MCHC: 33.1 g/dL (ref 31.5–35.7)
MCV: 97 fL (ref 79–97)
Monocytes Absolute: 0.6 10*3/uL (ref 0.1–0.9)
Monocytes: 10 %
Neutrophils Absolute: 3.5 10*3/uL (ref 1.4–7.0)
Neutrophils: 56 %
Platelets: 248 10*3/uL (ref 150–379)
RBC: 4 x10E6/uL — ABNORMAL LOW (ref 4.14–5.80)
RDW: 13.6 % (ref 12.3–15.4)
WBC: 6.2 10*3/uL (ref 3.4–10.8)

## 2016-07-26 LAB — CMP14+EGFR
ALT: 27 IU/L (ref 0–44)
AST: 22 IU/L (ref 0–40)
Albumin/Globulin Ratio: 2.2 (ref 1.2–2.2)
Albumin: 4.3 g/dL (ref 3.6–4.8)
Alkaline Phosphatase: 54 IU/L (ref 39–117)
BUN/Creatinine Ratio: 19 (ref 10–24)
BUN: 16 mg/dL (ref 8–27)
Bilirubin Total: 0.3 mg/dL (ref 0.0–1.2)
CO2: 24 mmol/L (ref 18–29)
Calcium: 9.3 mg/dL (ref 8.6–10.2)
Chloride: 104 mmol/L (ref 96–106)
Creatinine, Ser: 0.86 mg/dL (ref 0.76–1.27)
GFR calc Af Amer: 107 mL/min/{1.73_m2} (ref >59)
GFR calc non Af Amer: 93 mL/min/{1.73_m2} (ref >59)
Globulin, Total: 2 g/dL (ref 1.5–4.5)
Glucose: 106 mg/dL — ABNORMAL HIGH (ref 65–99)
Potassium: 4.7 mmol/L (ref 3.5–5.2)
Sodium: 141 mmol/L (ref 134–144)
Total Protein: 6.3 g/dL (ref 6.0–8.5)

## 2016-07-26 LAB — LIPID PANEL
Chol/HDL Ratio: 3 ratio (ref 0.0–5.0)
Cholesterol, Total: 151 mg/dL (ref 100–199)
HDL: 50 mg/dL (ref >39)
LDL Calculated: 91 mg/dL (ref 0–99)
Triglycerides: 52 mg/dL (ref 0–149)
VLDL Cholesterol Cal: 10 mg/dL (ref 5–40)

## 2016-07-26 LAB — POCT URINALYSIS DIP (MANUAL ENTRY)
Bilirubin, UA: NEGATIVE
Blood, UA: NEGATIVE
Glucose, UA: NEGATIVE mg/dL
Ketones, POC UA: NEGATIVE mg/dL
Leukocytes, UA: NEGATIVE
Nitrite, UA: NEGATIVE
Protein Ur, POC: NEGATIVE mg/dL
Spec Grav, UA: <=1.005 — AB (ref 1.010–1.025)
Urobilinogen, UA: 0.2 U/dL
pH, UA: 5 (ref 5.0–8.0)

## 2016-07-26 LAB — POC MICROSCOPIC URINALYSIS (UMFC): Mucus: ABSENT

## 2016-07-26 NOTE — Patient Instructions (Signed)
     IF you received an x-ray today, you will receive an invoice from [facility]. Please contact [facility] at [phone] with questions or concerns regarding your invoice.   IF you received labwork today, you will receive an invoice from [facility]. Please contact [facility] at [phone] with questions or concerns regarding your invoice.   Our billing staff will not be able to assist you with questions regarding bills from these companies.  You will be contacted with the lab results as soon as they are available. The fastest way to get your results is to activate your My Chart account. Instructions are located on the last page of this paperwork. If you have not heard from us regarding the results in 2 weeks, please contact this office.     

## 2016-07-26 NOTE — Progress Notes (Signed)
SHAY

## 2016-07-26 NOTE — Progress Notes (Signed)
[facility] at [place], [place] [address] [phone]  Date:  07/26/2016   Name:  Gabriel Pittman   DOB:  [birth year]   MRN:  [id number]  PCP:  Michel Santee, MD    Chief Complaint: Annual Exam   History of Present Illness:  This is a 61 y.o. male with PMH HTN, elevated LDL and carotid artery stenosis who is presenting for CPE. He is retired and is active in the community. His wife continues to work long hours in [employer], educational, so he helps around the house with chores and cooking.  He is fasting today.   H/o HLD - taking Atorvastatin 82m and Lisinopril 572m He Reported a dry cough for several months after taking Lisinopril - this has improved.   H/o CAS - sees cards q 2 years.    Last CPE 2016 Complaints:  Seasonal allergies - taking claritin.  "Summer time diaper rash". He has changed his soap and laundry detergent to unscented. He has seen derm in the past for this about 2 years ago, was treated with a cream. Would like refill   Immunizations: UTD  Dentist: regularly - has deteriorating gum issues.  Eye: regularly  Diet: tries to eat healthy.  Exercise: 3 x a week.  Fam hx: heart disease, DM, HLD   Sexual hx: married. Active.  Urinary hesitancy/frequency or nocturia: wakes up 2-3 times a night. No hesitancy or incomplete voiding.  Problems with erectile dysfunction: None Tobacco/alcohol/substance use: Quit 2017 - was smoking 1ppd x 20+ years; no alcohol; smokes marijuana occasionally   Colonoscopy: 12/2014. Recommend next in 3-5 years.   Review of Systems:  Review of Systems  Constitutional: Negative for activity change, appetite change, chills, diaphoresis and fatigue.  HENT: Positive for congestion. Negative for dental problem, sneezing and tinnitus.   Eyes: Negative for visual disturbance.  Respiratory: Positive for cough. Negative for chest tightness, shortness of breath and wheezing.   Cardiovascular: Negative for chest pain, palpitations and leg  swelling.  Gastrointestinal: Negative for abdominal pain, blood in stool, constipation, diarrhea, nausea and vomiting.  Endocrine: Negative for polydipsia, polyphagia and polyuria.  Genitourinary: Negative for decreased urine volume, difficulty urinating, discharge, hematuria, scrotal swelling and testicular pain.  Musculoskeletal: Negative for arthralgias, back pain, neck pain and neck stiffness.  Skin: Positive for rash.  Allergic/Immunologic: Positive for environmental allergies. Negative for food allergies.  Neurological: Negative for dizziness, syncope, weakness, light-headedness and headaches.  Psychiatric/Behavioral: Negative for sleep disturbance. The patient is not nervous/anxious.     Patient Active Problem List   Diagnosis Date Noted  . Bilateral carotid artery stenosis 06/17/2015  . History of melanoma excision 11/10/2013  . 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 Authorizing Provider  atorvastatin (LIPITOR) 20 MG tablet Take 1 tablet (20 mg total) by mouth daily. 06/05/16  Yes WiTimmothy EulerBrTanzania, PA-C  lisinopril (PRINIVIL,ZESTRIL) 5 MG tablet Take 1 tablet (5 mg total) by mouth daily. 12/20/15  Yes Abigaile Rossie, ElGelene MinkPA-C  aspirin 325 MG tablet Take 166 mg by mouth daily.     [provider]  Loratadine (CLARITIN PO) Take 10 mg by mouth daily.    [provider]  NONFORMULARY OR COMPOUNDED [facility]:  Antiiflammatory Cream - Diclofenac 3%, Baclofen 2%, Cyclobenzaprine 2%, Lidocaine 2% dispense 180grams apply 1-2 grams to affected area 3-4 times a day, +5refills. Patient not taking:  Reported on 07/26/2016 02/19/15   Wallene Huh, DPM    Allergies  Allergen Reactions  . Codeine Nausea Only  . Levofloxacin Hives    Past Surgical History:  Procedure Laterality Date  . AORTA SURGERY     congenital narrowing repair  . MELANOMA  EXCISION Right 1990   RIGHT posterior shoulder    Social History  Substance Use Topics  . Smoking status: Former Smoker    Packs/day: 0.50    Years: 20.00    Types: Cigarettes    Quit date: 03/14/2015  . Smokeless tobacco: Current User  . Alcohol use No    Family History  Problem Relation Age of Onset  . Heart disease Mother   . Heart disease Father   . Diabetes Father   . Cancer Sister        per patient of the bone  . Hyperlipidemia Sister   . Heart disease Brother   . Colon cancer Neg Hx     Medication list has been reviewed and updated.  Physical Examination:  Physical Exam  Constitutional: He is oriented to person, place, and time. He appears well-developed and well-nourished. No distress.  HENT:  Head: Normocephalic and atraumatic.  Right Ear: External ear normal.  Left Ear: External ear normal.  Nose: Nose normal.  Mouth/Throat: No oropharyngeal exudate.  Eyes: Conjunctivae and EOM are normal. Pupils are equal, round, and reactive to light.  Neck: Normal range of motion. Carotid bruit is present (right). No thyromegaly present.  Cardiovascular: Normal rate, regular rhythm, normal heart sounds and intact distal pulses.   No murmur heard. Pulmonary/Chest: Effort normal and breath sounds normal. No respiratory distress. He has no wheezes.        Scar right A/P trunk. 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   Abdominal: Soft. Normal appearance, normal aorta and bowel sounds are normal. He exhibits no distension, no abdominal bruit and no mass. There is no tenderness.  Musculoskeletal: Normal range of motion. He exhibits no edema.  Lymphadenopathy:    He has no cervical adenopathy.  Neurological: He is alert and oriented to person, place, and time. He has normal reflexes.  Skin: Skin is warm and dry.  Psychiatric: He has a normal mood and affect. His behavior is normal. Judgment and thought content normal.  Vitals reviewed.   BP 134/77   Pulse (!)  56   Temp 98.1 F (36.7 C) (Oral)   Resp 18   Ht '5\' 9"'  (1.753 m)   Wt 157 lb 3.2 oz (71.3 kg)   SpO2 99%   BMI 23.21 kg/m   Assessment and Plan:   1. Annual physical exam - H/o stable carotid artery stenosis. Carotid bruit on PE. Pt sees cards q 2 years.  - UTD colonoscopy. Next colonoscopy in 1-3 years. UTD immunizations.  - Former smoker 20+ pack year history. Quit last year. H/o hematuria - resolved.  - Pt has a month or so of medications left. Will refill as requested from pharmacy.  - Labs are pending.  RTC in 1 year for fasting labs and exam 2. Carotid stenosis, bilateral - Stable. Sees cards regularly.   3. Irritant dermatitis - Pt uses non-formulary cream 1:1 Ketoconazole Cutivate which he received from dermatologist in 2016. OK to refill    4. History of hematuria - POCT urinalysis dipstick - POCT Microscopic Urinalysis ([facility]) - Hematuria has resolved. Will not refer to urology.  5. Dyslipidemia - Lipid panel  6. Screening for diabetes  mellitus - CMP14+EGFR  7. Tobacco abuse disorder - POCT urinalysis dipstick - POCT Microscopic Urinalysis ([facility]) - Resolved.  8. Screening, anemia, deficiency, iron - CBC with Differential/Platelet  Mercer Pod, PA-C  [facility] [place] 07/26/2016 8:25 AM

## 2017-02-23 ENCOUNTER — Other Ambulatory Visit: Payer: Self-pay | Admitting: Physician Assistant

## 2017-02-23 DIAGNOSIS — I6523 Occlusion and stenosis of bilateral carotid arteries: Secondary | ICD-10-CM

## 2017-06-18 ENCOUNTER — Ambulatory Visit (INDEPENDENT_AMBULATORY_CARE_PROVIDER_SITE_OTHER): Payer: BLUE CROSS/BLUE SHIELD | Admitting: Emergency Medicine

## 2017-06-18 ENCOUNTER — Other Ambulatory Visit: Payer: Self-pay

## 2017-06-18 ENCOUNTER — Encounter: Payer: Self-pay | Admitting: Emergency Medicine

## 2017-06-18 VITALS — BP 146/67 | HR 67 | Temp 98.3°F | Resp 16 | Ht 69.0 in | Wt 160.0 lb

## 2017-06-18 DIAGNOSIS — I10 Essential (primary) hypertension: Secondary | ICD-10-CM

## 2017-06-18 DIAGNOSIS — Z8639 Personal history of other endocrine, nutritional and metabolic disease: Secondary | ICD-10-CM | POA: Diagnosis not present

## 2017-06-18 DIAGNOSIS — I6523 Occlusion and stenosis of bilateral carotid arteries: Secondary | ICD-10-CM | POA: Diagnosis not present

## 2017-06-18 DIAGNOSIS — Z8774 Personal history of (corrected) congenital malformations of heart and circulatory system: Secondary | ICD-10-CM | POA: Diagnosis not present

## 2017-06-18 DIAGNOSIS — A6002 Herpesviral infection of other male genital organs: Secondary | ICD-10-CM | POA: Diagnosis not present

## 2017-06-18 MED ORDER — VALACYCLOVIR HCL 1 G PO TABS: 1000.0000 mg | ORAL_TABLET | Freq: Two times a day (BID) | ORAL | 2 refills | Status: DC

## 2017-06-18 NOTE — Progress Notes (Signed)
Gabriel Pittman 62 y.o.   Chief Complaint  Patient presents with  . Genital Herpes    would like medicine, per pt" have had this since the 80's"  . Referral    to Cardiolgist, "had heart surgery yrs ago in 1961", don't have a regular Cardiologist, have seen Neurologist    HISTORY OF PRESENT ILLNESS: This is a 62 y.o. male complaining of genital herpes typical painful lesions.  Has a chronic history. Also requesting a cardiology referral.  HPI   Prior to Admission medications   Medication Sig Start Date End Date Taking? Authorizing Provider  aspirin 325 MG tablet Take 166 mg by mouth daily.    Yes [provider]  atorvastatin (LIPITOR) 20 MG tablet Take 1 tablet (20 mg total) by mouth daily. 06/05/16  Yes Timmothy Euler, [place] D, PA-C  lisinopril (PRINIVIL,ZESTRIL) 5 MG tablet TAKE 1 TABLET BY MOUTH DAILY 02/23/17  Yes McVey, Gelene Mink, PA-C  Loratadine (CLARITIN PO) Take 10 mg by mouth daily.   Yes [provider]    Allergies  Allergen Reactions  . Codeine Nausea Only  . Levofloxacin Hives    Patient Active Problem List   Diagnosis Date Noted  . Bilateral carotid artery stenosis 06/17/2015  . History of melanoma excision 11/10/2013  . S/P surgery for complex congenital heart disease 07/01/2011    Past Medical History:  Diagnosis Date  . Allergy   . Arthritis   . Melanoma ([place]) 1990   RIGHT posterior shoulder    Past Surgical History:  Procedure Laterality Date  . AORTA SURGERY     congenital narrowing repair  . MELANOMA EXCISION Right 1990   RIGHT posterior shoulder    Social History   Socioeconomic History  . Marital status: Married    Spouse name: Gabriel Pittman  . Number of children: 0  . Years of education: Not on file  . Highest education level: Not on file  Occupational History  . Occupation: retired  Scientific laboratory technician  . Financial resource strain: Not on file  . Food insecurity:    Worry: Not on file    Inability: Not on file  .  Transportation needs:    Medical: Not on file    Non-medical: Not on file  Tobacco Use  . Smoking status: Former Smoker    Packs/day: 0.50    Years: 20.00    Pack years: 10.00    Types: Cigarettes    Last attempt to quit: 03/14/2015    Years since quitting: 2.2  . Smokeless tobacco: Current User  Substance and Sexual Activity  . Alcohol use: No    Alcohol/week: 0.0 oz  . Drug use: No  . Sexual activity: Not on file  Lifestyle  . Physical activity:    Days per week: Not on file    Minutes per session: Not on file  . Stress: Not on file  Relationships  . Social connections:    Talks on phone: Not on file    Gets together: Not on file    Attends religious service: Not on file    Active member of club or organization: Not on file    Attends meetings of clubs or organizations: Not on file    Relationship status: Not on file  . Intimate partner violence:    Fear of current or ex partner: Not on file    Emotionally abused: Not on file    Physically abused: Not on file    Forced sexual activity: Not on  file  Other Topics Concern  . Not on file  Social History Narrative   Lives with his wife.  She has 2 children from a previous relationship.   Exercise at gym cardio and strength training 3-4 times/week    Family History  Problem Relation Age of Onset  . Heart disease Mother   . Heart disease Father   . Diabetes Father   . Cancer Sister        per patient of the bone  . Hyperlipidemia Sister   . Heart disease Brother   . Colon cancer Neg Hx      ROS   Physical Exam  Constitutional: He is oriented to person, place, and time. He appears well-developed and well-nourished.  HENT:  Head: Normocephalic and atraumatic.  Eyes: Pupils are equal, round, and reactive to light. EOM are normal.  Neck: Normal range of motion. Neck supple.  Cardiovascular: Normal rate and regular rhythm.  Pulmonary/Chest: Effort normal and breath sounds normal.  Abdominal: Soft. There is no  tenderness.  Genitourinary:  Genitourinary Comments: Positive genital herpetic lesions at the suprapubic area.  Musculoskeletal: Normal range of motion.  Neurological: He is alert and oriented to person, place, and time.  Skin: Skin is warm and dry. Capillary refill takes less than 2 seconds.  Psychiatric: He has a normal mood and affect. His behavior is normal.  Vitals reviewed.    ASSESSMENT & PLAN: [name] was seen today for genital herpes and referral.  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  Essential hypertension -     Ambulatory referral to Cardiology  History of coarctation of aorta -     Ambulatory referral to Cardiology  Bilateral carotid artery stenosis  History of high cholesterol -     Ambulatory referral to Cardiology    Patient Instructions  Genital Herpes Genital herpes is a common sexually transmitted infection (STI) that is caused by a virus. The virus spreads from person to person through sexual contact. Infection can cause itching, blisters, and sores around the genitals or rectum. Symptoms may last several days and then go away This is called an outbreak. However, the virus remains in your body, so you may have more outbreaks in the future. The time between outbreaks varies and can be months or years. Genital herpes affects men and women. It is particularly concerning for pregnant women because the virus can be passed to the baby during delivery and can cause serious problems. Genital herpes is also a concern for people who have a weak disease-fighting (immune) system. What are the causes? This condition is caused by the herpes simplex virus (HSV) type 1 or type 2. The virus may spread through:  Sexual contact with an infected person, including vaginal, anal, and oral sex.  Contact with  fluid from a herpes sore.  The skin. This means that you can get herpes from an infected partner even if he or she does not have a visible sore or does not know that he or she is infected.  What increases the risk? You are more likely to develop this condition if:  You have sex with many partners.  You do not use latex condoms during sex.  What are the signs or symptoms? Most people do not have symptoms (asymptomatic) or have mild symptoms that  may be mistaken for other skin problems. Symptoms may include:  Small red bumps near the genitals, rectum, or mouth. These bumps turn into blisters and then turn into sores.  Flu-like symptoms, including: ? Fever. ? Body aches. ? Swollen lymph nodes. ? Headache.  Painful urination.  Pain and itching in the genital area or rectal area.  Vaginal discharge.  Tingling or shooting pain in the legs and buttocks.  Generally, symptoms are more severe and last longer during the first (primary) outbreak. Flu-like symptoms are also more common during the primary outbreak. How is this diagnosed? Genital herpes may be diagnosed based on:  A physical exam.  Your medical history.  Blood tests.  A test of a fluid sample (culture) from an open sore.  How is this treated? There is no cure for this condition, but treatment with antiviral medicines that are taken by mouth (orally) can do the following:  Speed up healing and relieve symptoms.  Help to reduce the spread of the virus to sexual partners.  Limit the chance of future outbreaks, or make future outbreaks shorter.  Lessen symptoms of future outbreaks.  Your health care provider may also recommend pain relief medicines, such as aspirin or ibuprofen. Follow these instructions at home: Sexual activity  Do not have sexual contact during active outbreaks.  Practice safe sex. Latex condoms and male condoms may help prevent the spread of the herpes virus. General instructions  Keep  the affected areas dry and clean.  Take over-the-counter and prescription medicines only as told by your health care provider.  Avoid rubbing or touching blisters and sores. If you do touch blisters or sores: ? Wash your hands thoroughly with soap and water. ? Do not touch your eyes afterward.  To help relieve pain or itching, you may take the following actions as directed by your health care provider: ? Apply a cold, wet cloth (cold compress) to affected areas 4-6 times a day. ? Apply a substance that protects your skin and reduces bleeding (astringent). ? Apply a gel that helps relieve pain around sores (lidocaine gel). ? Take a warm, shallow bath that cleans the genital area (sitz bath).  Keep all follow-up visits as told by your health care provider. This is important. How is this prevented?  Use condoms. Although anyone can get genital herpes during sexual contact, even with the use of a condom, a condom can provide some protection.  Avoid having multiple sexual partners.  Talk with your sexual partner about any symptoms either of you may have. Also, talk with your partner about any history of STIs.  Get tested for STIs before you have sex. Ask your partner to do the same.  Do not have sexual contact if you have symptoms of genital herpes. Contact a health care provider if:  Your symptoms are not improving with medicine.  Your symptoms return.  You have new symptoms.  You have a fever.  You have abdominal pain.  You have redness, swelling, or pain in your eye.  You notice new sores on other parts of your body.  You are a woman and experience bleeding between menstrual periods.  You have had herpes and you become pregnant or plan to become pregnant. Summary  Genital herpes is a common sexually transmitted infection (STI) that is caused by the herpes simplex virus (HSV) type 1 or type 2.  These viruses are most often spread through sexual contact with an infected  person.  You are more likely to  develop this condition if you have sex with many partners or you have unprotected sex.  Most people do not have symptoms (asymptomatic) or have mild symptoms that may be mistaken for other skin problems. Symptoms occur as outbreaks that may happen months or years apart.  There is no cure for this condition, but treatment with oral antiviral medicines can reduce symptoms, reduce the chance of spreading the virus to a partner, prevent future outbreaks, or shorten future outbreaks. This information is not intended to replace advice given to you by your health care provider. Make sure you discuss any questions you have with your health care provider. Document Released: 02/25/2000 Document Revised: 01/28/2016 Document Reviewed: 01/28/2016 Elsevier Interactive Patient Education  2018 Elsevier Inc.      Agustina Caroli, MD [facility]

## 2017-06-18 NOTE — Patient Instructions (Signed)

## 2017-07-13 ENCOUNTER — Encounter: Payer: Self-pay | Admitting: Physician Assistant

## 2017-07-13 DIAGNOSIS — Q251 Coarctation of aorta: Secondary | ICD-10-CM | POA: Insufficient documentation

## 2017-07-13 NOTE — Progress Notes (Signed)
Cardiology Office Note    Date:  07/16/2017  ID:  Gabriel Pittman, DOB 21-Feb-1956, MRN [id number] PCP:  Michel Santee, MD  Cardiologist:  New, reviewed with Dr. Harrington Challenger   Chief Complaint: establish care due to prior heart surgery  History of Present Illness:  Gabriel Pittman is a 62 y.o. male with history of coarctation of aorta s/p remote surgery, HTN, carotid artery disease, hyperlipidemia, recovered polysubstance abuse who is being seen today for evaluation of prior heart surgery at the request of Dr. Mitchel Honour.   He had coarctation repair at age 76 at [facility]. He was followed by congenital team as a child but not into adulthood. He was followed by neurology for a period of time due to migraines and carotid disease but his neurologist retired. Fortunately the migraines ceased. Last CT angio neck 2017 showed complex atherosclerotic disease at both carotid bifurcations, maximal stenosis of the ICA on the right is 40% and on the left is 50%, atherosclerotic irregularity in both carotid siphon regions could be a source of embolic disease, external carotid stenoses bilaterally, greater than 90% on the left and at least 50% on the right. 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 Last labs showed: 07/2016: Hgb 12.8, K 4.7, Cr 0.86, glucose 106, normal LFTs, LDL 91. He believes this was checked when he was on atorvastatin 20mg . He has since cut the dose to 10mg  due to persistent symptoms of depression. This improved after the dose reduction. He is a recovered alcoholic and drug user, sober for 11 years now. He also quit smoking 2 years ago. He exercises at the gym regularly without any functional limitation, CP, SOB, syncope. + Family hx of CAD - father and brother both had CAD and CHF in their 60s-60s.  Past Medical History:  Diagnosis Date    . Allergy   . Arthritis   . Carotid artery disease ([place])   . Coarctation of aorta    a. s/p remote surgery.  . Essential hypertension   . Former tobacco use   . Hyperlipidemia   . Melanoma ([place]) 1990   RIGHT posterior shoulder  . Polysubstance abuse ([place])    a. former alcoholic, drug use ("all of them"). Sober for 11 years as of 2019.    Past Surgical History:  Procedure Laterality Date  . AORTA SURGERY     congenital narrowing repair  . MELANOMA EXCISION Right 1990   RIGHT posterior shoulder    Current Medications: Current Meds  Medication Sig  . aspirin 325 MG tablet Take 166 mg by mouth daily.   Marland Kitchen atorvastatin (LIPITOR) 20 MG tablet Take 1 tablet (20 mg total) by mouth daily. (Patient taking differently: Take 10 mg by mouth daily. )  . lisinopril (PRINIVIL,ZESTRIL) 5 MG tablet TAKE 1 TABLET BY MOUTH DAILY  . Loratadine (CLARITIN PO) Take 10 mg by mouth daily.  . valACYclovir (VALTREX) 1000 MG tablet Take 1 tablet (1,000 mg total) by mouth 2 (two) times daily. (Patient taking differently: Take 1,000 mg by mouth as needed. )    Allergies:   Codeine and Levofloxacin   Social History   Socioeconomic History  . Marital status: Married    Spouse name: Izora Gala  . Number of children: 0  . Years of education: Not on file  . Highest education level: Not on file  Occupational History  . Occupation: retired  Scientific laboratory technician  . Financial resource strain: Not on file  . Food insecurity:    Worry: Not on file    Inability: Not on file  . Transportation needs:    Medical: Not on file    Non-medical: Not on file  Tobacco Use  . Smoking status: Former Smoker    Packs/day: 0.50    Years: 20.00    Pack years: 10.00    Types: Cigarettes    Last attempt to quit: 03/14/2015    Years since quitting: 2.3  . Smokeless tobacco: Current User  Substance and Sexual Activity  . Alcohol use: No    Alcohol/week: 0.0 oz  . Drug use: No  . Sexual activity: Not on file  Lifestyle  .  Physical activity:    Days per week: Not on file    Minutes per session: Not on file  . Stress: Not on file  Relationships  . Social connections:    Talks on phone: Not on file    Gets together: Not on file    Attends religious service: Not on file    Active member of club or organization: Not on file    Attends meetings of clubs or organizations: Not on file    Relationship status: Not on file  Other Topics Concern  . Not on file  Social History Narrative   Lives with his wife.  She has 2 children from a previous relationship.   Exercise at gym cardio and strength training 3-4 times/week     Family History:  Family History  Problem Relation Age of Onset  . Heart disease Mother   . Heart disease Father   . Diabetes Father   . Cancer Sister        per patient of the bone  . Hyperlipidemia Sister   . Heart disease Brother   . Colon cancer Neg Hx     ROS:   Please see the history of present illness. All other systems are reviewed and otherwise negative.    PHYSICAL EXAM:   VS:  BP (!) 152/90 (BP Location: Right Arm, Patient Position: Sitting, Cuff Size: Normal)   Pulse 61   Ht 5\' 9"  (1.753 m)   Wt 160 lb (72.6 kg)   SpO2 98%   BMI 23.63 kg/m   BMI: Body mass index is 23.63 kg/m. GEN: Well nourished, well developed thin WM, in no acute distress  HEENT: normocephalic, atraumatic Neck: no JVD or masses. Soft R carotid bruit. No L carotid bruit. Cardiac: RRR; very soft SEM. No rubs or gallops, no edema. 2+ pedal pulses and radial pulses, equal bilaterally - no femoral delay. Respiratory:  clear to auscultation bilaterally, normal work of breathing GI: soft, nontender, nondistended, + BS MS: no deformity or atrophy  Skin: warm and dry, no rash Neuro:  Alert and Oriented x 3, Strength and sensation are intact, follows commands Psych: euthymic mood, full affect  Wt Readings from Last 3 Encounters:  07/16/17 160 lb (72.6 kg)  06/18/17 160 lb (72.6 kg)  07/26/16 157 lb  3.2 oz (71.3 kg)      Studies/Labs Reviewed:   EKG:  EKG was ordered today and personally reviewed by me and demonstrates sinus bradycardia 59bpm nonspecific ST-T changes inferiorly, V5-V6.No prior to compare to.  Recent Labs: 07/26/2016: ALT 27; BUN 16; Creatinine, Ser 0.86; Hemoglobin 12.8; Platelets 248; Potassium 4.7; Sodium 141   Lipid Panel    Component Value Date/Time  CHOL 151 07/26/2016 0845   TRIG 52 07/26/2016 0845   HDL 50 07/26/2016 0845   CHOLHDL 3.0 07/26/2016 0845   CHOLHDL 2.7 12/21/2015 0810   VLDL 11 12/21/2015 0810   LDLCALC 91 07/26/2016 0845    Additional studies/ records that were reviewed today include: Summarized above.    ASSESSMENT & PLAN:   This patient's case was discussed in depth with Dr. Harrington Challenger. The plan below was formulated per our discussion.  1. Coarctation of the aorta repair - plan MRI/MRA of the chest to evaluate along with echocardiogram. It has been many years since his last echo.  2. HTN - BP elevated, recheck confirmed by me 096 systolic. He reports BP 130s/70s at home but has not followed regularly. This was also elevated at his visit with Dr. Mitchel Honour. He has already taken lisinopril today. Will increase to 10mg  for several days, then if tolerated, increase further to 20mg  daily. Check BMET today. Also advised he check BP regularly at home, bring a log to next visit, and call if running outside our goal range of systolic 283-662. 3. Carotid artery disease  - plan MRI/MRA of the neck at the time of coarctation evaluation as above. See below regarding statin. Decrease ASA to 81mg  daily. 4. Hyperlipidemia - LDL goal is <70 given known carotid disease and family history of CAD. He is not describing any signs of cardiac ischemia. He had mental depression with higher dose of atorvastatin. Per d/w Dr. Harrington Challenger, will switch to Crestor 10mg  daily. Check lipids and LFTs today. If he is tolerating Crestor at next visit, would need to arrange f/u  liver/lipids.  Disposition: F/u with Dr. Lula Olszewski team APP after above testing for review.   Medication Adjustments/Labs and Tests Ordered: Current medicines are reviewed at length with the patient today.  Concerns regarding medicines are outlined above. Medication changes, Labs and Tests ordered today are summarized above and listed in the Patient Instructions accessible in Encounters.   Signed, Charlie Pitter, PA-C  07/16/2017 8:53 AM    [address], [place], [place]  [address] Phone: [phone]; Fax: [phone]

## 2017-07-16 ENCOUNTER — Encounter: Payer: Self-pay | Admitting: Physician Assistant

## 2017-07-16 ENCOUNTER — Ambulatory Visit: Payer: BLUE CROSS/BLUE SHIELD | Admitting: Physician Assistant

## 2017-07-16 VITALS — BP 152/90 | HR 61 | Ht 69.0 in | Wt 160.0 lb

## 2017-07-16 DIAGNOSIS — Q251 Coarctation of aorta: Secondary | ICD-10-CM | POA: Diagnosis not present

## 2017-07-16 DIAGNOSIS — Z79899 Other long term (current) drug therapy: Secondary | ICD-10-CM | POA: Diagnosis not present

## 2017-07-16 DIAGNOSIS — I10 Essential (primary) hypertension: Secondary | ICD-10-CM

## 2017-07-16 DIAGNOSIS — I6523 Occlusion and stenosis of bilateral carotid arteries: Secondary | ICD-10-CM

## 2017-07-16 DIAGNOSIS — E785 Hyperlipidemia, unspecified: Secondary | ICD-10-CM | POA: Diagnosis not present

## 2017-07-16 LAB — HEPATIC FUNCTION PANEL
ALT: 20 IU/L (ref 0–44)
AST: 20 IU/L (ref 0–40)
Albumin: 4.6 g/dL (ref 3.6–4.8)
Alkaline Phosphatase: 61 IU/L (ref 39–117)
BILIRUBIN TOTAL: 0.4 mg/dL (ref 0.0–1.2)
BILIRUBIN, DIRECT: 0.11 mg/dL (ref 0.00–0.40)
Total Protein: 7 g/dL (ref 6.0–8.5)

## 2017-07-16 LAB — BASIC METABOLIC PANEL
BUN / CREAT RATIO: 17 (ref 10–24)
BUN: 16 mg/dL (ref 8–27)
CHLORIDE: 101 mmol/L (ref 96–106)
CO2: 25 mmol/L (ref 20–29)
Calcium: 9.5 mg/dL (ref 8.6–10.2)
Creatinine, Ser: 0.93 mg/dL (ref 0.76–1.27)
GFR calc non Af Amer: 87 mL/min/{1.73_m2} (ref >59)
GFR, EST AFRICAN AMERICAN: 101 mL/min/{1.73_m2} (ref >59)
Glucose: 95 mg/dL (ref 65–99)
POTASSIUM: 4.5 mmol/L (ref 3.5–5.2)
Sodium: 141 mmol/L (ref 134–144)

## 2017-07-16 MED ORDER — ASPIRIN EC 81 MG PO TBEC: 81.0000 mg | DELAYED_RELEASE_TABLET | Freq: Every day | ORAL | 3 refills | Status: DC

## 2017-07-16 MED ORDER — ROSUVASTATIN CALCIUM 10 MG PO TABS: 10.0000 mg | ORAL_TABLET | Freq: Every day | ORAL | 2 refills | Status: DC

## 2017-07-16 MED ORDER — LISINOPRIL 20 MG PO TABS: 20.0000 mg | ORAL_TABLET | Freq: Every day | ORAL | 3 refills | Status: DC

## 2017-07-16 NOTE — Addendum Note (Signed)
Addended by: Stanton Kidney on: 07/16/2017 01:51 PM   Modules accepted: Orders

## 2017-07-16 NOTE — Patient Instructions (Addendum)
Medication Instructions:  Your physician has recommended you make the following change in your medication:  1. STOP Lipitor 2. START Crestor 10 mg every evening 3. INCREASE Lisinopril to 20 mg once daily. You may take 2 of your 5mg  tablets (10mg ) daily for a few days to make sure you tolerate it, then go to new 20mg  dose daily. 4. DECREASE Aspirin to 81 mg once daily  Labwork: Today: BMET, LFTs & Lipid profile  Your physician recommends that you return for lab work in: 1-2 weeks for BMET   Testing/Procedures: Your physician has requested that you have an echocardiogram. Echocardiography is a painless test that uses sound waves to create images of your heart. It provides your doctor with information about the size and shape of your heart and how well your heart's chambers and valves are working. This procedure takes approximately one hour. There are no restrictions for this procedure.  Your physician recommends that you have an MRI/MRA of the neck to evaluate your carotid stenosis.  Your physician recommends that you have an MRI/MRA of the chest to evaluate coarctation of the aorta.   Follow-Up: Your physician recommends that you schedule a follow-up appointment with Dr. Harrington Challenger or her care team after tests have been completed.  * If you need a refill on your cardiac medications before your next appointment, please call your pharmacy.   *Please note that any paperwork needing to be filled out by the provider will need to be addressed at the front desk prior to seeing the provider. Please note that any FMLA, disability or other documents regarding health condition is subject to a $25.00 charge that must be received prior to completion of paperwork in the form of a money order or check.  Thank you for choosing [facility]!!    Any Other Special Instructions Will Be Listed Below (If Applicable).  Please monitor your blood pressures at home and keep a list/log.  Bring this list to your  next appointment. Please call if the top number falls outside the goal range of 110-130.   Blood Pressure Record Sheet Your blood pressure on this visit to the emergency department or clinic is elevated. This does not necessarily mean you have high blood pressure (hypertension), but it does mean that your blood pressure needs to be rechecked. Many times your blood pressure can increase due to illness, pain, anxiety, or other factors. We recommend that you get a series of blood pressure readings done over a period of 5 days. It is best to get a reading in the morning and one in the evening. You should make sure to sit and relax for 1-5 minutes before the reading is taken. Write the readings down and make a follow-up appointment with your health care provider to discuss the results. If there is not a free clinic or a drug store with a blood-pressure-taking machine near you, you can purchase blood-pressure-taking equipment from a drug store. Having one in the home allows you the convenience of taking your blood pressure while you are home and relaxed. Blood Pressure Log Date: _______________________  a.m. _____________________  p.m. _____________________  Date: _______________________  a.m. _____________________  p.m. _____________________  Date: _______________________  a.m. _____________________  p.m. _____________________  Date: _______________________  a.m. _____________________  p.m. _____________________  Date: _______________________  a.m. _____________________  p.m. _____________________  This information is not intended to replace advice given to you by your health care provider. Make sure you discuss any questions you have with your health care  provider. Document Released: 11/26/2002 Document Revised: 02/11/2016 Document Reviewed: 04/22/2013 Elsevier Interactive Patient Education  2018 [employer].

## 2017-07-17 ENCOUNTER — Other Ambulatory Visit: Payer: Self-pay | Admitting: *Deleted

## 2017-07-17 ENCOUNTER — Encounter: Payer: Self-pay | Admitting: Physician Assistant

## 2017-07-17 ENCOUNTER — Encounter: Payer: Self-pay | Admitting: Cardiology

## 2017-07-17 DIAGNOSIS — Q251 Coarctation of aorta: Secondary | ICD-10-CM

## 2017-07-17 DIAGNOSIS — I6523 Occlusion and stenosis of bilateral carotid arteries: Secondary | ICD-10-CM

## 2017-07-17 DIAGNOSIS — Z79899 Other long term (current) drug therapy: Secondary | ICD-10-CM

## 2017-07-17 NOTE — Telephone Encounter (Signed)
This encounter was created in error - please disregard.

## 2017-07-17 NOTE — Telephone Encounter (Signed)
Follow Up:; ° ° °Returning your call. °

## 2017-07-20 ENCOUNTER — Encounter (HOSPITAL_COMMUNITY): Payer: Self-pay

## 2017-07-20 ENCOUNTER — Ambulatory Visit (HOSPITAL_COMMUNITY)
Admission: RE | Admit: 2017-07-20 | Discharge: 2017-07-20 | Disposition: A | Discharge status: Home / Self Care | Payer: BLUE CROSS/BLUE SHIELD | Source: Ambulatory Visit | Attending: Physician Assistant | Admitting: Physician Assistant

## 2017-07-20 DIAGNOSIS — I6523 Occlusion and stenosis of bilateral carotid arteries: Secondary | ICD-10-CM | POA: Insufficient documentation

## 2017-07-20 DIAGNOSIS — Q251 Coarctation of aorta: Secondary | ICD-10-CM | POA: Insufficient documentation

## 2017-07-20 MED ORDER — GADOBENATE DIMEGLUMINE 529 MG/ML IV SOLN
15.0000 mL | Freq: Once | INTRAVENOUS | Status: AC | PRN
Start: 2017-07-20 — End: 2017-07-20
  Administered 2017-07-20: 15 mL via INTRAVENOUS

## 2017-07-23 ENCOUNTER — Other Ambulatory Visit: Payer: Self-pay

## 2017-07-23 ENCOUNTER — Other Ambulatory Visit: Payer: BLUE CROSS/BLUE SHIELD | Admitting: *Deleted

## 2017-07-23 ENCOUNTER — Ambulatory Visit (HOSPITAL_COMMUNITY): Payer: BLUE CROSS/BLUE SHIELD | Attending: Cardiology

## 2017-07-23 DIAGNOSIS — Q251 Coarctation of aorta: Secondary | ICD-10-CM

## 2017-07-23 DIAGNOSIS — I10 Essential (primary) hypertension: Secondary | ICD-10-CM | POA: Diagnosis not present

## 2017-07-23 DIAGNOSIS — E785 Hyperlipidemia, unspecified: Secondary | ICD-10-CM | POA: Insufficient documentation

## 2017-07-23 DIAGNOSIS — Z79899 Other long term (current) drug therapy: Secondary | ICD-10-CM

## 2017-07-23 DIAGNOSIS — I6523 Occlusion and stenosis of bilateral carotid arteries: Secondary | ICD-10-CM

## 2017-07-23 LAB — LIPID PANEL
CHOLESTEROL TOTAL: 148 mg/dL (ref 100–199)
Chol/HDL Ratio: 3.4 ratio (ref 0.0–5.0)
HDL: 44 mg/dL (ref >39)
LDL CALC: 91 mg/dL (ref 0–99)
TRIGLYCERIDES: 67 mg/dL (ref 0–149)
VLDL CHOLESTEROL CAL: 13 mg/dL (ref 5–40)

## 2017-07-23 LAB — BASIC METABOLIC PANEL
BUN/Creatinine Ratio: 16 (ref 10–24)
BUN: 15 mg/dL (ref 8–27)
CALCIUM: 9.3 mg/dL (ref 8.6–10.2)
CHLORIDE: 104 mmol/L (ref 96–106)
CO2: 23 mmol/L (ref 20–29)
Creatinine, Ser: 0.96 mg/dL (ref 0.76–1.27)
GFR calc Af Amer: 97 mL/min/{1.73_m2} (ref >59)
GFR calc non Af Amer: 84 mL/min/{1.73_m2} (ref >59)
Glucose: 98 mg/dL (ref 65–99)
Potassium: 4.5 mmol/L (ref 3.5–5.2)
Sodium: 141 mmol/L (ref 134–144)

## 2017-07-23 NOTE — Progress Notes (Signed)
Pt wife, Izora Gala, [place] on file, has been made aware of pts test results and she verbalized understanding.

## 2017-07-24 ENCOUNTER — Ambulatory Visit (HOSPITAL_COMMUNITY)
Admission: RE | Admit: 2017-07-24 | Discharge: 2017-07-24 | Disposition: A | Discharge status: Home / Self Care | Payer: BLUE CROSS/BLUE SHIELD | Source: Ambulatory Visit | Attending: Physician Assistant | Admitting: Physician Assistant

## 2017-07-24 ENCOUNTER — Telehealth: Payer: Self-pay | Admitting: Physician Assistant

## 2017-07-24 DIAGNOSIS — Q251 Coarctation of aorta: Secondary | ICD-10-CM | POA: Insufficient documentation

## 2017-07-24 DIAGNOSIS — I77810 Thoracic aortic ectasia: Secondary | ICD-10-CM | POA: Diagnosis not present

## 2017-07-24 MED ORDER — GADOBENATE DIMEGLUMINE 529 MG/ML IV SOLN
20.0000 mL | Freq: Once | INTRAVENOUS | Status: AC | PRN
  Administered 2017-07-24: 20 mL via INTRAVENOUS

## 2017-07-24 NOTE — Telephone Encounter (Signed)
Follow Up: ° ° °Returning your call,concerning his lab results. °

## 2017-07-25 NOTE — Telephone Encounter (Signed)
Returned pts call re: lab / echo results. See result note.

## 2017-07-25 NOTE — Telephone Encounter (Signed)
-----   Message from Charlie Pitter, [place] sent at 07/24/2017 12:56 PM EDT ----- Labs are stable. Continue plan as discussed. LDL slightly above goal but just started Crestor last week.  Dayna Dunn PA-C

## 2017-08-01 ENCOUNTER — Other Ambulatory Visit: Payer: Self-pay | Admitting: *Deleted

## 2017-08-01 ENCOUNTER — Telehealth: Payer: Self-pay | Admitting: Physician Assistant

## 2017-08-01 DIAGNOSIS — I77810 Thoracic aortic ectasia: Secondary | ICD-10-CM

## 2017-08-01 NOTE — Telephone Encounter (Signed)
Pt informed. See MRA result dated 07/24/17.

## 2017-08-01 NOTE — Progress Notes (Signed)
Notes recorded by [name], MD on 07/25/2017 at 6:33 PM EDT Aortic root is mildly dilated  Mild narrowing of descending aorta  Does not appear to be significant from flow perspective REcomm: F/U 1 year to keep track of aortic root   Order placed for MRA chest w/ and w/o to be done in 1 year.

## 2017-08-01 NOTE — Telephone Encounter (Signed)
New message    Patient calling for MRA results

## 2017-08-03 ENCOUNTER — Other Ambulatory Visit: Payer: Self-pay

## 2017-08-03 ENCOUNTER — Ambulatory Visit (INDEPENDENT_AMBULATORY_CARE_PROVIDER_SITE_OTHER): Payer: BLUE CROSS/BLUE SHIELD | Admitting: Physician Assistant

## 2017-08-03 ENCOUNTER — Encounter: Payer: Self-pay | Admitting: Physician Assistant

## 2017-08-03 VITALS — BP 130/70 | HR 61 | Temp 98.1°F | Resp 16 | Ht 69.0 in | Wt 157.8 lb

## 2017-08-03 DIAGNOSIS — Z130 Encounter for screening for diseases of the blood and blood-forming organs and certain disorders involving the immune mechanism: Secondary | ICD-10-CM | POA: Diagnosis not present

## 2017-08-03 DIAGNOSIS — Z125 Encounter for screening for malignant neoplasm of prostate: Secondary | ICD-10-CM

## 2017-08-03 DIAGNOSIS — Z0000 Encounter for general adult medical examination without abnormal findings: Secondary | ICD-10-CM | POA: Diagnosis not present

## 2017-08-03 DIAGNOSIS — Z1329 Encounter for screening for other suspected endocrine disorder: Secondary | ICD-10-CM | POA: Diagnosis not present

## 2017-08-03 DIAGNOSIS — Z13228 Encounter for screening for other metabolic disorders: Secondary | ICD-10-CM

## 2017-08-03 LAB — POCT URINALYSIS DIP (MANUAL ENTRY)
Bilirubin, UA: NEGATIVE
Glucose, UA: NEGATIVE mg/dL
Ketones, POC UA: NEGATIVE mg/dL
Leukocytes, UA: NEGATIVE
Nitrite, UA: NEGATIVE
Protein Ur, POC: NEGATIVE mg/dL
Spec Grav, UA: 1.01 (ref 1.010–1.025)
Urobilinogen, UA: 0.2 E.U./dL
pH, UA: 7 (ref 5.0–8.0)

## 2017-08-03 NOTE — Patient Instructions (Addendum)
Health Maintenance, Male A healthy lifestyle and preventive care is important for your health and wellness. Ask your health care provider about what schedule of regular examinations is right for you. What should I know about weight and diet? Eat a Healthy Diet  Eat plenty of vegetables, fruits, whole grains, low-fat dairy products, and lean protein.  Do not eat a lot of foods high in solid fats, added sugars, or salt.  Maintain a Healthy Weight Regular exercise can help you achieve or maintain a healthy weight. You should:  Do at least 150 minutes of exercise each week. The exercise should increase your heart rate and make you sweat (moderate-intensity exercise).  Do strength-training exercises at least twice a week.  Watch Your Levels of Cholesterol and Blood Lipids  Have your blood tested for lipids and cholesterol every 5 years starting at 64 years of age. If you are at high risk for heart disease, you should start having your blood tested when you are 64 years old. You may need to have your cholesterol levels checked more often if: ? Your lipid or cholesterol levels are high. ? You are older than 64 years of age. ? You are at high risk for heart disease.  What should I know about cancer screening? Many types of cancers can be detected early and may often be prevented. Lung Cancer  You should be screened every year for lung cancer if: ? You are a current smoker who has smoked for at least 30 years. ? You are a former smoker who has quit within the past 15 years.  Talk to your health care provider about your screening options, when you should start screening, and how often you should be screened.  Colorectal Cancer  Routine colorectal cancer screening usually begins at 64 years of age and should be repeated every 5-10 years until you are 64 years old. You may need to be screened more often if early forms of precancerous polyps or small growths are found. Your health care provider  may recommend screening at an earlier age if you have risk factors for colon cancer.  Your health care provider may recommend using home test kits to check for hidden blood in the stool.  A small camera at the end of a tube can be used to examine your colon (sigmoidoscopy or colonoscopy). This checks for the earliest forms of colorectal cancer.  Prostate and Testicular Cancer  Depending on your age and overall health, your health care provider may do certain tests to screen for prostate and testicular cancer.  Talk to your health care provider about any symptoms or concerns you have about testicular or prostate cancer.  Skin Cancer  Check your skin from head to toe regularly.  Tell your health care provider about any new moles or changes in moles, especially if: ? There is a change in a mole's size, shape, or color. ? You have a mole that is larger than a pencil eraser.  Always use sunscreen. Apply sunscreen liberally and repeat throughout the day.  Protect yourself by wearing long sleeves, pants, a wide-brimmed hat, and sunglasses when outside.  What should I know about heart disease, diabetes, and high blood pressure?  If you are 18-39 years of age, have your blood pressure checked every 3-5 years. If you are 40 years of age or older, have your blood pressure checked every year. You should have your blood pressure measured twice-once when you are at a hospital or clinic, and once when   you are not at a hospital or clinic. Record the average of the two measurements. To check your blood pressure when you are not at a hospital or clinic, you can use: ? An automated blood pressure machine at a pharmacy. ? A home blood pressure monitor.  Talk to your health care provider about your target blood pressure.  If you are between 45-79 years old, ask your health care provider if you should take aspirin to prevent heart disease.  Have regular diabetes screenings by checking your fasting blood  sugar level. ? If you are at a normal weight and have a low risk for diabetes, have this test once every three years after the age of 45. ? If you are overweight and have a high risk for diabetes, consider being tested at a younger age or more often.  A one-time screening for abdominal aortic aneurysm (AAA) by ultrasound is recommended for men aged 65-75 years who are current or former smokers. What should I know about preventing infection? Hepatitis B If you have a higher risk for hepatitis B, you should be screened for this virus. Talk with your health care provider to find out if you are at risk for hepatitis B infection. Hepatitis C Blood testing is recommended for:  Everyone born from 1945 through 1965.  Anyone with known risk factors for hepatitis C.  Sexually Transmitted Diseases (STDs)  You should be screened each year for STDs including gonorrhea and chlamydia if: ? You are sexually active and are younger than 64 years of age. ? You are older than 64 years of age and your health care provider tells you that you are at risk for this type of infection. ? Your sexual activity has changed since you were last screened and you are at an increased risk for chlamydia or gonorrhea. Ask your health care provider if you are at risk.  Talk with your health care provider about whether you are at high risk of being infected with HIV. Your health care provider may recommend a prescription medicine to help prevent HIV infection.  What else can I do?  Schedule regular health, dental, and eye exams.  Stay current with your vaccines (immunizations).  Do not use any tobacco products, such as cigarettes, chewing tobacco, and e-cigarettes. If you need help quitting, ask your health care provider.  Limit alcohol intake to no more than 2 drinks per day. One drink equals 12 ounces of beer, 5 ounces of wine, or 1 ounces of hard liquor.  Do not use street drugs.  Do not share needles.  Ask your  health care provider for help if you need support or information about quitting drugs.  Tell your health care provider if you often feel depressed.  Tell your health care provider if you have ever been abused or do not feel safe at home. This information is not intended to replace advice given to you by your health care provider. Make sure you discuss any questions you have with your health care provider. Document Released: 08/26/2007 Document Revised: 10/27/2015 Document Reviewed: 12/01/2014 Elsevier Interactive Patient Education  2018 [employer]     IF you received an x-ray today, you will receive an invoice from [facility]. Please contact [facility] at [phone] with questions or concerns regarding your invoice.   IF you received labwork today, you will receive an invoice from [facility]. Please contact [facility] at [phone] with questions or concerns regarding your invoice.   Our billing staff will not be   able to assist you with questions regarding bills from these companies.  You will be contacted with the lab results as soon as they are available. The fastest way to get your results is to activate your My Chart account. Instructions are located on the last page of this paperwork. If you have not heard from us regarding the results in 2 weeks, please contact this office.       

## 2017-08-03 NOTE — Progress Notes (Signed)
[facility] [place], [place] [address] [phone]  Date:  08/03/2017   Name:  Gabriel Pittman   DOB:  08-Oct-1955   MRN:  [id number]  PCP:  Michel Santee, MD    Chief Complaint: Annual Exam (health maintenance HIV screening)   History of Present Illness:  This is a 62 y.o. male with PMH coarctation of aorta s/p remote surgery, HTN, carotid artery disease, hyperlipidemia, recovered polysubstance abuse who is presenting for CPE. Last CPE one year ago.   H/o HLD and HTN - taking Crestor 62m and Lisinopril 267m Denies medication side effects. He is taking home blood pressures, which have all been lower than 130/70   H/o CAS - sees cards q 2 years. Last appt was two weeks ago.  MR chest and neck last month. He has f/u appt next week    Complaints:  Possible hernia right upper abdomen. Pain with doing things like sit ups. Always feels better when he sits down and relaxes.  Immunizations: UTD Dentist: regular appts - has deteriorating gum issues Eye: checked regularly Diet: eating protein and water. Takes multivitamin.  Exercise: 3 x a week. "I do a little bit of everything"   Fam hx: family history includes Cancer in his sister; Diabetes in his father, mother, and sister; Heart disease in his brother, father, and mother; Hyperlipidemia in his mother and sister; Hypertension in his father. Sexual hx: married.  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 Quit 2017 - was smoking 1ppd x 20+ years; no alcohol; smokes marijuana occasionally  Colonoscopy: 12/2014. Recommend next screening in 3-5 years.  Pt  has a past medical history of Allergy, Arthritis, Carotid artery disease ([facility] Coarctation of aorta, Essential hypertension, Former tobacco use, Hyperlipidemia, Melanoma ([facility](1990), and Polysubstance abuse ([facility]   Review of Systems:  Review  of Systems  Constitutional: Negative for activity change, appetite change and fatigue.  HENT: Positive for sneezing. Negative for congestion, dental problem and tinnitus.   Eyes: Negative for visual disturbance.  Respiratory: Negative for cough, chest tightness, shortness of breath and wheezing.   Cardiovascular: Negative for chest pain, palpitations and leg swelling.  Gastrointestinal: Positive for abdominal pain. Negative for blood in stool, constipation, diarrhea, nausea and vomiting.  Endocrine: Negative for polydipsia, polyphagia and polyuria.  Genitourinary: Negative for decreased urine volume, difficulty urinating, discharge, hematuria, scrotal swelling and testicular pain.  Musculoskeletal: Negative for arthralgias, back pain and neck stiffness.  Allergic/Immunologic: Negative for environmental allergies and food allergies.  Neurological: Negative for dizziness, syncope, weakness, light-headedness and headaches.  Psychiatric/Behavioral: Negative for sleep disturbance. The patient is not nervous/anxious.     Patient Active Problem List   Diagnosis Date Noted  . Coarctation of aorta 07/13/2017  . Bilateral carotid artery stenosis 06/17/2015  . History of melanoma excision 11/10/2013  . 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 Authorizing Provider  aspirin EC 81 MG tablet Take 1 tablet (81 mg total) by mouth daily. 07/16/17   Dunn, DaNedra HaiPA-C  lisinopril (PRINIVIL,ZESTRIL) 20 MG tablet Take 1 tablet (20 mg total) by mouth daily. 07/16/17   Dunn, DaNedra HaiPA-C  Loratadine (CLARITIN PO) Take 10 mg by mouth daily.    [provider]  rosuvastatin (CRESTOR) 10 MG tablet Take 1 tablet (10 mg total)  by mouth daily. 07/16/17   Dunn, Nedra Hai, PA-C  valACYclovir (VALTREX) 1000 MG tablet Take 1 tablet (1,000 mg total) by mouth 2 (two) times daily. Patient taking differently: Take 1,000 mg  by mouth as needed.  06/18/17   Horald Pollen, MD    Allergies  Allergen Reactions  . Codeine Nausea Only  . Levofloxacin Hives    Past Surgical History:  Procedure Laterality Date  . AORTA SURGERY     congenital narrowing repair  . MELANOMA EXCISION Right 1990   RIGHT posterior shoulder    Social History   Tobacco Use  . Smoking status: Former Smoker    Packs/day: 0.50    Years: 20.00    Pack years: 10.00    Types: Cigarettes    Last attempt to quit: 03/14/2015    Years since quitting: 2.3  . Smokeless tobacco: Current User  Substance Use Topics  . Alcohol use: No    Alcohol/week: 0.0 oz  . Drug use: No    Family History  Problem Relation Age of Onset  . Heart disease Mother   . Diabetes Mother   . Hyperlipidemia Mother   . Heart disease Father   . Diabetes Father   . Hypertension Father   . Cancer Sister        per patient of the bone  . Hyperlipidemia Sister   . Diabetes Sister   . Heart disease Brother   . Colon cancer Neg Hx     Medication list has been reviewed and updated.  Physical Examination:  Physical Exam  Constitutional: He is oriented to person, place, and time. No distress.  HENT:  Head: Normocephalic and atraumatic.  Right Ear: External ear normal.  Left Ear: External ear normal.  Nose: Nose normal.  Mouth/Throat: Oropharynx is clear and moist. No oropharyngeal exudate.  Eyes: Pupils are equal, round, and reactive to light. Conjunctivae and EOM are normal. Right eye exhibits no discharge. No scleral icterus.  Neck: Normal range of motion. Neck supple. No tracheal deviation present. No thyromegaly present.  Cardiovascular: Normal rate, regular rhythm and intact distal pulses.  No murmur heard. Pulmonary/Chest: Effort normal and breath sounds normal. No respiratory distress. He has no wheezes.  Abdominal: Soft. Bowel sounds are normal. He exhibits no distension and no mass. There is no tenderness.    2-3 cm possible break in  muscle wall. No hernia present. No incarceration or strangulation. No skin changes, warmth or firmness.   Genitourinary: No discharge found.  Musculoskeletal: Normal range of motion.  Lymphadenopathy:    He has no cervical adenopathy.  Neurological: He is alert and oriented to person, place, and time. He has normal reflexes.  Skin: Skin is warm and dry. He is not diaphoretic.  Psychiatric: Judgment normal.    BP 130/70 (BP Location: Left Arm, Patient Position: Sitting, Cuff Size: Normal)   Pulse 61   Temp 98.1 F (36.7 C) (Oral)   Resp 16   Ht '5\' 9"'  (1.753 m)   Wt 157 lb 12.8 oz (71.6 kg)   SpO2 98%   BMI 23.30 kg/m   Lab Results  Component Value Date   CHOL 148 07/23/2017   HDL 44 07/23/2017   LDLCALC 91 07/23/2017   TRIG 67 07/23/2017   CHOLHDL 3.4 07/23/2017   Lab Results  Component Value Date   PSA 0.60 12/16/2014    Assessment and Plan: 1. Annual physical exam - pt presents for annual exam. Routine labs are pending, will contact  with results. Blood pressure is controlled and are also controlled at home, con't current medications: Crestor 4m and Lisinopril 220m  He has f/u appt next week with cards. He c/o abdominal hernia. I found no evidence of incarceration or strangulation. Discussed warning signs with pt, he understands. Colonoscopy is due in the next year or two. RTC in 1 year for annual exam.   2. Screening for prostate cancer - PSA  3. Screening for endocrine, metabolic and immunity disorder - CMP14+EGFR - CBC with Differential/Platelet - POCT urinalysis dipstick   WhMercer PodPA-C  [facility]/24/2019 9:13 AM

## 2017-08-04 LAB — CBC WITH DIFFERENTIAL/PLATELET
Basophils Absolute: 0 10*3/uL (ref 0.0–0.2)
Basos: 0 %
EOS (ABSOLUTE): 0.1 10*3/uL (ref 0.0–0.4)
Eos: 2 %
Hematocrit: 40.2 % (ref 37.5–51.0)
Hemoglobin: 13.6 g/dL (ref 13.0–17.7)
Immature Grans (Abs): 0 10*3/uL (ref 0.0–0.1)
Immature Granulocytes: 0 %
Lymphocytes Absolute: 1.8 10*3/uL (ref 0.7–3.1)
Lymphs: 29 %
MCH: 32.2 pg (ref 26.6–33.0)
MCHC: 33.8 g/dL (ref 31.5–35.7)
MCV: 95 fL (ref 79–97)
Monocytes Absolute: 0.6 10*3/uL (ref 0.1–0.9)
Monocytes: 10 %
Neutrophils Absolute: 3.6 10*3/uL (ref 1.4–7.0)
Neutrophils: 59 %
Platelets: 282 10*3/uL (ref 150–450)
RBC: 4.22 x10E6/uL (ref 4.14–5.80)
RDW: 13.4 % (ref 12.3–15.4)
WBC: 6.1 10*3/uL (ref 3.4–10.8)

## 2017-08-04 LAB — CMP14+EGFR
ALT: 20 IU/L (ref 0–44)
AST: 22 IU/L (ref 0–40)
Albumin/Globulin Ratio: 2 (ref 1.2–2.2)
Albumin: 4.5 g/dL (ref 3.6–4.8)
Alkaline Phosphatase: 61 IU/L (ref 39–117)
BUN/Creatinine Ratio: 16 (ref 10–24)
BUN: 14 mg/dL (ref 8–27)
Bilirubin Total: 0.4 mg/dL (ref 0.0–1.2)
CO2: 26 mmol/L (ref 20–29)
Calcium: 9.3 mg/dL (ref 8.6–10.2)
Chloride: 104 mmol/L (ref 96–106)
Creatinine, Ser: 0.88 mg/dL (ref 0.76–1.27)
GFR calc Af Amer: 106 mL/min/{1.73_m2} (ref >59)
GFR calc non Af Amer: 91 mL/min/{1.73_m2} (ref >59)
Globulin, Total: 2.3 g/dL (ref 1.5–4.5)
Glucose: 95 mg/dL (ref 65–99)
Potassium: 4.7 mmol/L (ref 3.5–5.2)
Sodium: 141 mmol/L (ref 134–144)
Total Protein: 6.8 g/dL (ref 6.0–8.5)

## 2017-08-04 LAB — PSA: Prostate Specific Ag, Serum: 0.7 ng/mL (ref 0.0–4.0)

## 2017-08-06 DIAGNOSIS — I77810 Thoracic aortic ectasia: Secondary | ICD-10-CM | POA: Insufficient documentation

## 2017-08-06 DIAGNOSIS — I10 Essential (primary) hypertension: Secondary | ICD-10-CM | POA: Insufficient documentation

## 2017-08-06 DIAGNOSIS — E785 Hyperlipidemia, unspecified: Secondary | ICD-10-CM | POA: Insufficient documentation

## 2017-08-06 NOTE — Progress Notes (Signed)
Cardiology Office Note:    Date:  08/07/2017   ID:  Gabriel Pittman, DOB 1955/12/31, MRN [id number]  PCP:  Dorise Hiss, PA-C  Cardiologist:  Dorris Carnes, MD  Referring MD: Michel Santee, MD   Chief Complaint  Patient presents with  . Follow-up    testing    History of Present Illness:    Gabriel Pittman is a 62 y.o. 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 He had coarctation repair at age 42 at [facility]. He was followed by congenital team as a child but not into adulthood. He was followed by neurology for a period of time but his neurologist retired. Fortunately his migraines have ceased.  He was seen on 07/16/17 by Melina Copa, PA or evaluation of prior heart surgery. It was noted that a CT angio neck 2017 showed complex atherosclerotic disease at both carotid bifurcations, maximal stenosis of the ICA on the right is 40% and on the left is 50%, atherosclerotic irregularity in both carotid siphon regions could be a source of embolic disease, external carotid stenoses bilaterally, greater than 90% on the left and at least 50% on the right. There was abnormal aortic anatomy with probable from the coarctation of the aorta with chronic calcification; distal origin of the left subclavian artery which is a very large vessel at its origin and proximally.   Dayna ordered an MRI/MRA of the chest and echocardiogram to evaluate. MRA of the chest on 07/24/17 shows evidence of prior coarctation repair with residual luminal narrowing of the proximal descending thoracic aorta, No enlarged collateral vessels are seen to suggest significant coarctation physiology. It also showed Mild dilatation of the aortic root measuring up to 4.4 cm in diameter and ascending thoracic aorta measuring up to 4.2 cm. Recommendation for annual imaging followup by CTA or MRA.   [name] is here  today alone for follow up after testing. His PCP recently increased his lisinopril for better BP control. Home BP's 110's-120's/60's. He has brief occ lightheadedness when he raises up from a squatted position, but is otherwise tolerating this med change well. He has no chest discomfort, dyspnea, orthopnea, PND or edema.   He is a recovered alcoholic and drug user, sober for 11 years now. He also quit smoking 2 years ago. He exercises at the gym regularly without any functional limitation and also goes hiking.     Past Medical History:  Diagnosis Date  . Allergy   . Arthritis   . Carotid artery disease ([place])   . Coarctation of aorta    a. s/p remote surgery.  . Essential hypertension   . Former tobacco use   . Hyperlipidemia   . Melanoma ([place]) 1990   RIGHT posterior shoulder  . Polysubstance abuse ([place])    a. former alcoholic, drug use ("all of them"). Sober for 11 years as of 2019.    Past Surgical History:  Procedure Laterality Date  . AORTA SURGERY     congenital narrowing repair  . MELANOMA EXCISION Right 1990   RIGHT posterior shoulder    Current Medications: Current Meds  Medication Sig  . aspirin EC 81 MG tablet Take 1 tablet (81 mg total) by mouth daily.  Marland Kitchen lisinopril (PRINIVIL,ZESTRIL) 20 MG tablet Take 1 tablet (20 mg total) by mouth daily.  . Loratadine (CLARITIN PO) Take 10 mg by mouth daily.  . rosuvastatin (CRESTOR) 10 MG  tablet Take 1 tablet (10 mg total) by mouth daily.  . valACYclovir (VALTREX) 1000 MG tablet Take 1,000 mg by mouth 2 (two) times daily as needed.  . [DISCONTINUED] valACYclovir (VALTREX) 1000 MG tablet Take 1 tablet (1,000 mg total) by mouth 2 (two) times daily. (Patient taking differently: Take 1,000 mg by mouth as needed. )     Allergies:   Codeine and Levofloxacin   Social History   Socioeconomic History  . Marital status: Married    Spouse name: Izora Gala  . Number of children: 0  . Years of education: Not on file  . Highest education  level: Not on file  Occupational History  . Occupation: retired  Scientific laboratory technician  . Financial resource strain: Not on file  . Food insecurity:    Worry: Not on file    Inability: Not on file  . Transportation needs:    Medical: Not on file    Non-medical: Not on file  Tobacco Use  . Smoking status: Former Smoker    Packs/day: 0.50    Years: 20.00    Pack years: 10.00    Types: Cigarettes    Last attempt to quit: 03/14/2015    Years since quitting: 2.4  . Smokeless tobacco: Current User  Substance and Sexual Activity  . Alcohol use: No    Alcohol/week: 0.0 oz  . Drug use: No  . Sexual activity: Not on file  Lifestyle  . Physical activity:    Days per week: Not on file    Minutes per session: Not on file  . Stress: Not on file  Relationships  . Social connections:    Talks on phone: Not on file    Gets together: Not on file    Attends religious service: Not on file    Active member of club or organization: Not on file    Attends meetings of clubs or organizations: Not on file    Relationship status: Not on file  Other Topics Concern  . Not on file  Social History Narrative   Lives with his wife.  She has 2 children from a previous relationship.   Exercise at gym cardio and strength training 3-4 times/week     Family History: The patient's family history includes Cancer in his sister; Diabetes in his father, mother, and sister; Heart disease in his brother, father, and mother; Hyperlipidemia in his mother and sister; Hypertension in his father. There is no history of Colon cancer. ROS:   Please see the history of present illness.     All other systems reviewed and are negative.  EKGs/Labs/Other Studies Reviewed:    The following studies were reviewed today:  MRA chest 07/24/17  IMPRESSION: 1. Mild dilatation of the aortic root measuring up to 4.4 cm in diameter and ascending thoracic aorta measuring up to 4.2 cm. Recommend annual imaging followup by CTA or MRA.  This recommendation follows 2010 ACCF/AHA/AATS/ACR/ASA/SCA/SCAI/SIR/STS/SVM Guidelines for the Diagnosis and Management of Patients with Thoracic Aortic Disease. Circulation. 2010; 121: T016-W109 2. Evidence of prior coarctation repair with residual luminal narrowing of the proximal descending thoracic aorta. No enlarged collateral vessels are seen to suggest significant coarctation Physiology.  MRA neck 07/21/17  IMPRESSION: Image quality on today's study is not as good as on previous exams, but I think is sufficient for follow-up of previous CT angiograms of 2017 and 2015 and previous MR angiogram of 2015. The study does not suggest progressive disease. There is atherosclerotic disease at both carotid bifurcations with  proximal ICA stenosis estimated at 40% on the right and 50% on the left. See above for full discussion.  Echocardiogram 07/23/17 Study Conclusions  - Left ventricle: The cavity size was normal. Systolic function was   normal. The estimated ejection fraction was in the range of 55%   to 60%. Wall motion was normal; there were no regional wall   motion abnormalities. Left ventricular diastolic function   parameters were normal. - Aortic valve: Trileaflet; normal thickness, mildly calcified   leaflets. There was mild regurgitation. Regurgitation pressure   half-time: 871 ms. - Aorta: S/P coarctation repair with peak gradient across repair of   30mmHg. Aortic root dimension: 41 mm (ED). Ascending aorta   diameter: 40 mm (ED). - Aortic root: The aortic root was mildly dilated. - Ascending aorta: The ascending aorta was mildly dilated. - Mitral valve: Elongation of the anterior leaflet. Prolapse. - Pulmonic valve: There was trivial regurgitation.  Recommendations:  consider cardiac/chest MRI/MRA to assess coarctation further.   EKG:  EKG is not ordered today.    Recent Labs: 08/03/2017: ALT 20; BUN 14; Creatinine, Ser 0.88; Hemoglobin 13.6; Platelets 282;  Potassium 4.7; Sodium 141   Recent Lipid Panel    Component Value Date/Time   CHOL 148 07/23/2017 0859   TRIG 67 07/23/2017 0859   HDL 44 07/23/2017 0859   CHOLHDL 3.4 07/23/2017 0859   CHOLHDL 2.7 12/21/2015 0810   VLDL 11 12/21/2015 0810   LDLCALC 91 07/23/2017 0859    Physical Exam:    VS:  BP 118/62   Pulse (!) 58   Ht 5\' 9"  (1.753 m)   Wt 160 lb 12.8 oz (72.9 kg)   SpO2 99%   BMI 23.75 kg/m     Wt Readings from Last 3 Encounters:  08/07/17 160 lb 12.8 oz (72.9 kg)  08/03/17 157 lb 12.8 oz (71.6 kg)  07/16/17 160 lb (72.6 kg)     Physical Exam  Constitutional: He is oriented to person, place, and time. He appears well-developed and well-nourished. No distress.  HENT:  Head: Normocephalic and atraumatic.  Neck: Normal range of motion. Neck supple. No JVD present. Carotid bruit is present.  Bilateral.  Cardiovascular: Normal rate, regular rhythm and intact distal pulses. Exam reveals no gallop and no friction rub.  Murmur heard.  Harsh crescendo-decrescendo systolic murmur is present with a grade of 2/6 at the apex. Pulmonary/Chest: Effort normal and breath sounds normal. No respiratory distress. He has no wheezes. He has no rales.  Abdominal: Soft. Bowel sounds are normal.  Musculoskeletal: Normal range of motion. He exhibits no edema or deformity.  Neurological: He is alert and oriented to person, place, and time.  Skin: Skin is warm and dry.  Psychiatric: He has a normal mood and affect. His behavior is normal. Thought content normal.  Vitals reviewed.   ASSESSMENT:    1. Mitral valve prolapse   2. Coarctation of aorta   3. S/P surgery for complex congenital heart disease   4. Essential (primary) hypertension   5. Aortic root dilatation (HCC)   6. Bilateral carotid artery stenosis   7. Hyperlipidemia LDL goal <70    PLAN:    In order of problems listed above:  S/P coarctation of the aorta repair: at age 50. MRA of the chest on 07/24/17 shows evidence  of prior coarctation repair with residual luminal narrowing of the proximal descending thoracic aorta, No enlarged collateral vessels are seen to suggest significant coarctation physiology. It also showed Mild dilatation  of the aortic root measuring up to 4.4 cm in diameter and ascending thoracic aorta measuring up to 4.2 cm. Recommendation for annual imaging followup by CTA or MRA. Will arrange for MRA and follow up with Dr. Harrington Challenger for results in 1 year.   Mild aortic root dilitation: 4.4 cm. ascending thoracic aorta measuring up to 4.2 cm. Recommendation for annual imaging followup by CTA or MRA. (order has been placed) Needs good BP control.   Hypertension: BP elevated at recent office visit so lisinopril was increased from 5 mg to 20 mg. BP is well controlled today and by his home measurements. Continue current therapy. Discussed heart healthy diet and exercise. Will give info on DASH diet.   Carotid artery disease: Stable. MRA 07/21/17 showed no progressive disease when compared to studies in 2015 and 2017. There is atherosclerotic disease at both carotid bifurcations with proximal ICA stenosis estimated at 40% on the right and 50% on the left. (poor quality study but felt to be adequate). No neurologic complaints. Continue aspirin and statin.   Hyperlipidemia: LDL goal <70 with his carotid artery disease and family history. LDL was 91 on 07/23/17. Atorvastatin switched to rosuvastatin 10 mg daily due to reported mental depression with increased atorvastatin.  No issues with Crestor. Recheck lipids in 6-8 weeks. We also discussed heart healthy diet, exercise, and continued smoking cessation.   Prolapsed mitral valve: Noted on recent echo. Has faint apical murmur. Asymptomatic.     Medication Adjustments/Labs and Tests Ordered: Current medicines are reviewed at length with the patient today.  Concerns regarding medicines are outlined above. Labs and tests ordered and medication changes are outlined  in the patient instructions below:  Patient Instructions  Medication Instructions: Your physician recommends that you continue on your current medications as directed. Please refer to the Current Medication list given to you today.   Labwork: FUTURE: Lft's & Lipids in 6 weeks 09/18/17  Procedures/Testing: None Ordered  Follow-Up: Your physician wants you to follow-up in: 1 year with Dr. Theressa Stamps will receive a reminder letter in the mail two months in advance. If you don't receive a letter, please call our office to schedule the follow-up appointment.   Any Additional Special Instructions Will Be Listed Below (If Applicable).     If you need a refill on your cardiac medications before your next appointment, please call your pharmacy.      Signed, Daune Perch, NP  08/07/2017 3:26 PM    [facility]

## 2017-08-07 ENCOUNTER — Encounter: Payer: Self-pay | Admitting: Cardiology

## 2017-08-07 ENCOUNTER — Ambulatory Visit: Payer: BLUE CROSS/BLUE SHIELD | Admitting: Cardiology

## 2017-08-07 VITALS — BP 118/62 | HR 58 | Ht 69.0 in | Wt 160.8 lb

## 2017-08-07 DIAGNOSIS — Z8774 Personal history of (corrected) congenital malformations of heart and circulatory system: Secondary | ICD-10-CM | POA: Diagnosis not present

## 2017-08-07 DIAGNOSIS — I10 Essential (primary) hypertension: Secondary | ICD-10-CM

## 2017-08-07 DIAGNOSIS — Q251 Coarctation of aorta: Secondary | ICD-10-CM

## 2017-08-07 DIAGNOSIS — I6523 Occlusion and stenosis of bilateral carotid arteries: Secondary | ICD-10-CM | POA: Diagnosis not present

## 2017-08-07 DIAGNOSIS — I341 Nonrheumatic mitral (valve) prolapse: Secondary | ICD-10-CM | POA: Diagnosis not present

## 2017-08-07 DIAGNOSIS — E785 Hyperlipidemia, unspecified: Secondary | ICD-10-CM

## 2017-08-07 DIAGNOSIS — I77810 Thoracic aortic ectasia: Secondary | ICD-10-CM | POA: Diagnosis not present

## 2017-08-07 NOTE — Patient Instructions (Signed)
Medication Instructions: Your physician recommends that you continue on your current medications as directed. Please refer to the Current Medication list given to you today.   Labwork: FUTURE: Lft's & Lipids in 6 weeks 09/18/17  Procedures/Testing: None Ordered  Follow-Up: Your physician wants you to follow-up in: 1 year with Dr. Theressa Stamps will receive a reminder letter in the mail two months in advance. If you don't receive a letter, please call our office to schedule the follow-up appointment.   Any Additional Special Instructions Will Be Listed Below (If Applicable).     If you need a refill on your cardiac medications before your next appointment, please call your pharmacy.

## 2017-08-13 ENCOUNTER — Ambulatory Visit: Payer: BLUE CROSS/BLUE SHIELD | Admitting: Podiatry

## 2017-08-13 ENCOUNTER — Ambulatory Visit (INDEPENDENT_AMBULATORY_CARE_PROVIDER_SITE_OTHER): Payer: BLUE CROSS/BLUE SHIELD | Admitting: Orthotics

## 2017-08-13 DIAGNOSIS — M722 Plantar fascial fibromatosis: Secondary | ICD-10-CM

## 2017-08-13 DIAGNOSIS — G5751 Tarsal tunnel syndrome, right lower limb: Secondary | ICD-10-CM

## 2017-08-13 DIAGNOSIS — M79671 Pain in right foot: Secondary | ICD-10-CM

## 2017-08-13 NOTE — Progress Notes (Signed)
Patient came into today for casting bilateral f/o to address plantar fasciitis.  Patient reports history of foot pain involving plantar aponeurosis.  Goal is to provide longitudinal arch support and correct any RF instability due to heel eversion/inversion.  Ultimate goal is to relieve tension at pf insertion calcaneal tuberosity.  Plan on semi-rigid device addressing heel stability and relieving PF tension.     Repeat order from [facility].

## 2017-08-29 ENCOUNTER — Ambulatory Visit: Payer: BLUE CROSS/BLUE SHIELD | Admitting: Orthotics

## 2017-08-29 DIAGNOSIS — G5751 Tarsal tunnel syndrome, right lower limb: Secondary | ICD-10-CM

## 2017-08-29 DIAGNOSIS — M79671 Pain in right foot: Secondary | ICD-10-CM

## 2017-08-29 NOTE — Progress Notes (Signed)
Patient came in today to pick up custom made foot orthotics.  The goals were accomplished and the patient reported no dissatisfaction with said orthotics.  Patient was advised of breakin period and how to report any issues. 

## 2017-09-18 ENCOUNTER — Other Ambulatory Visit: Payer: BLUE CROSS/BLUE SHIELD

## 2017-09-18 DIAGNOSIS — I77810 Thoracic aortic ectasia: Secondary | ICD-10-CM

## 2017-09-18 DIAGNOSIS — E785 Hyperlipidemia, unspecified: Secondary | ICD-10-CM

## 2017-09-18 LAB — LIPID PANEL
CHOL/HDL RATIO: 3.1 ratio (ref 0.0–5.0)
Cholesterol, Total: 143 mg/dL (ref 100–199)
HDL: 46 mg/dL (ref >39)
LDL CALC: 88 mg/dL (ref 0–99)
Triglycerides: 45 mg/dL (ref 0–149)
VLDL Cholesterol Cal: 9 mg/dL (ref 5–40)

## 2017-09-18 LAB — HEPATIC FUNCTION PANEL
ALBUMIN: 4.1 g/dL (ref 3.6–4.8)
ALK PHOS: 59 IU/L (ref 39–117)
ALT: 18 IU/L (ref 0–44)
AST: 19 IU/L (ref 0–40)
BILIRUBIN TOTAL: 0.2 mg/dL (ref 0.0–1.2)
Bilirubin, Direct: 0.09 mg/dL (ref 0.00–0.40)
Total Protein: 6.3 g/dL (ref 6.0–8.5)

## 2017-09-20 ENCOUNTER — Other Ambulatory Visit: Payer: Self-pay

## 2017-09-20 DIAGNOSIS — E785 Hyperlipidemia, unspecified: Secondary | ICD-10-CM

## 2017-09-20 MED ORDER — ROSUVASTATIN CALCIUM 20 MG PO TABS: 20.0000 mg | ORAL_TABLET | Freq: Every day | ORAL | 2 refills | Status: DC

## 2017-09-20 NOTE — Progress Notes (Signed)
Daune Perch, NP    Yes. Lipids and ALT in 6 - 8 weeks.    ------  Notes recorded by Sarina Ill, RN on 09/20/2017 at 1:51 PM EDT Patient informed about lab work and accepted to take 20 mg of Crestor daily. Fasting lab work appointment is on September 3. Will send new prescription to Pharmacy. Patient expressed understanding about fasting lab and medication dosage change. ------  Notes recorded by Sarina Ill, RN on 09/20/2017 at 1:22 PM EDT Left message to call back. ------  Notes recorded by Sarina Ill, RN on 09/20/2017 at 9:42 AM EDT If patient agrees to the medication change, would you like to order follow up labs? ------  Notes recorded by Daune Perch, NP on 09/20/2017 at 8:50 AM EDT The LDL (bad cholesterol) is 88. Ideally we would like it less than 70 with a carotid artery disease. If he is willing we could increase the Crestor to 20 mg daily, however if he does not really want to do this we could just continue current therapy and work on diet and exercise.  Daune Perch, NP

## 2017-11-13 ENCOUNTER — Other Ambulatory Visit: Payer: BLUE CROSS/BLUE SHIELD | Admitting: *Deleted

## 2017-11-13 DIAGNOSIS — E785 Hyperlipidemia, unspecified: Secondary | ICD-10-CM

## 2017-11-13 LAB — LIPID PANEL
Chol/HDL Ratio: 3.1 ratio (ref 0.0–5.0)
Cholesterol, Total: 151 mg/dL (ref 100–199)
HDL: 49 mg/dL (ref >39)
LDL Calculated: 87 mg/dL (ref 0–99)
Triglycerides: 73 mg/dL (ref 0–149)
VLDL Cholesterol Cal: 15 mg/dL (ref 5–40)

## 2017-11-13 LAB — HEPATIC FUNCTION PANEL
ALT: 23 IU/L (ref 0–44)
AST: 24 IU/L (ref 0–40)
Albumin: 4.4 g/dL (ref 3.6–4.8)
Alkaline Phosphatase: 55 IU/L (ref 39–117)
BILIRUBIN, DIRECT: 0.11 mg/dL (ref 0.00–0.40)
Bilirubin Total: 0.5 mg/dL (ref 0.0–1.2)
Total Protein: 6.5 g/dL (ref 6.0–8.5)

## 2017-12-19 ENCOUNTER — Telehealth: Payer: Self-pay | Admitting: Physician Assistant

## 2017-12-19 MED ORDER — LISINOPRIL 10 MG PO TABS: 10.0000 mg | ORAL_TABLET | Freq: Every day | ORAL | 3 refills | Status: DC

## 2017-12-19 NOTE — Telephone Encounter (Signed)
Agree   Cut back    If stil dizzy after cut back I would have him come in with BP monitor for BP check Check CBC and BMET if comes in

## 2017-12-19 NOTE — Telephone Encounter (Signed)
Spoke to patient who is concerned about his BP and symptoms of dizziness, light headedness and temporary blurred vision in the morning.    He was told on 07/16/17 that he should increase his Lisinopril from 5 mg to 10 mg and if tolerated increase to 20 mg.  He presently is taking 20 mg.  He said that these symptoms started shortly after the increase from 5 mg to 10 mg, but thought he could tolerate, but it is getting worse.  His Bps have been ranging from 323- 557 systolic, which I told him was great, but because he is symptomatic, he should reduce to 10 mg and monitor his BP for the next few days and report back to [place].  Please advise, thank you.

## 2017-12-19 NOTE — Telephone Encounter (Signed)
New Message           Pt c/o BP issue: STAT if pt c/o blurred vision, one-sided weakness or slurred speech  1. What are your last 5 BP readings?  111/54 last night  114/60   112/60   121/55    109/63  2. Are you having any other symptoms (ex. Dizziness, headache, blurred vision, passed out)? Dizziness/blurred vision   3. What is your BP issue? BP to low   Patient states his bp medication was increased and now bp is to low

## 2018-06-04 ENCOUNTER — Other Ambulatory Visit: Payer: Self-pay | Admitting: *Deleted

## 2018-06-04 MED ORDER — ROSUVASTATIN CALCIUM 20 MG PO TABS: 20.0000 mg | ORAL_TABLET | Freq: Every day | ORAL | 0 refills | Status: DC

## 2018-08-12 ENCOUNTER — Encounter: Payer: Self-pay | Admitting: Internal Medicine

## 2018-08-12 ENCOUNTER — Other Ambulatory Visit: Payer: Self-pay

## 2018-08-12 ENCOUNTER — Ambulatory Visit: Payer: BLUE CROSS/BLUE SHIELD | Admitting: Internal Medicine

## 2018-08-12 VITALS — BP 145/76 | HR 67 | Ht 69.0 in | Wt 162.6 lb

## 2018-08-12 DIAGNOSIS — I10 Essential (primary) hypertension: Secondary | ICD-10-CM

## 2018-08-12 DIAGNOSIS — I779 Disorder of arteries and arterioles, unspecified: Secondary | ICD-10-CM

## 2018-08-12 DIAGNOSIS — E785 Hyperlipidemia, unspecified: Secondary | ICD-10-CM | POA: Diagnosis not present

## 2018-08-12 DIAGNOSIS — E782 Mixed hyperlipidemia: Secondary | ICD-10-CM

## 2018-08-12 DIAGNOSIS — I739 Peripheral vascular disease, unspecified: Secondary | ICD-10-CM | POA: Diagnosis not present

## 2018-08-12 DIAGNOSIS — R3911 Hesitancy of micturition: Secondary | ICD-10-CM

## 2018-08-12 DIAGNOSIS — Q251 Coarctation of aorta: Secondary | ICD-10-CM

## 2018-08-12 NOTE — Patient Instructions (Signed)
Medication Instructions:  No changes If you need a refill on your cardiac medications before your next appointment, please call your pharmacy.   Lab work: Today: cbc, lipids, bmet, psa If you have labs (blood work) drawn today and your tests are completely normal, you will receive your results only by: Marland Kitchen MyChart Message (if you have MyChart) OR . A paper copy in the mail If you have any lab test that is abnormal or we need to change your treatment, we will call you to review the results.  Testing/Procedures: none  Follow-Up: At [facility], you and your health needs are our priority.  As part of our continuing mission to provide you with exceptional heart care, we have created designated Provider Care Teams.  These Care Teams include your primary Cardiologist (physician) and Advanced Practice Providers (APPs -  Physician Assistants and Nurse Practitioners) who all work together to provide you with the care you need, when you need it. You will need a follow up appointment in:  12 months.  Please call our office 2 months in advance to schedule this appointment.  You may see Dorris Carnes, MD or one of the following Advanced Practice Providers on your designated Care Team: Richardson Dopp, PA-C [place], [place] . Daune Perch, NP  Any Other Special Instructions Will Be Listed Below (If Applicable).

## 2018-08-12 NOTE — Progress Notes (Signed)
Cardiology Office Note   Date:  08/12/2018   ID:  Gabriel Pittman, DOB 01/06/1956, MRN [id number]  PCP:  Gabriel Hiss, PA-C  Cardiologist:   Dorris Carnes, MD    F/U of HTN and  Aortic pathology     History of Present Illness: Gabriel Pittman is a 63 y.o. male with a history of coarctation of the aorta (s/p remote repair), HTN, MVP  CV dz, polycustance abuse    MRA of the chest done in 07/24/17  Noted below   Plan for continued follow up in future  SInce seen he has done OK   Says his BP at home is usually around 118/    Denies CP   Breathing is OK  No PND   No orthopnea  No edema  No TIA like symptoms  Remains active  Had cold / viral type infection in March   Wonders if it was corona virus    Current Meds  Medication Sig  . aspirin EC 81 MG tablet Take 1 tablet (81 mg total) by mouth daily.  Marland Kitchen lisinopril (PRINIVIL,ZESTRIL) 10 MG tablet Take 1 tablet (10 mg total) by mouth daily.  . Loratadine (CLARITIN PO) Take 10 mg by mouth daily.  . rosuvastatin (CRESTOR) 20 MG tablet Take 1 tablet (20 mg total) by mouth daily.  . valACYclovir (VALTREX) 1000 MG tablet Take 1,000 mg by mouth 2 (two) times daily as needed.     Allergies:   Codeine and Levofloxacin   Past Medical History:  Diagnosis Date  . Allergy   . Arthritis   . Carotid artery disease ([place])   . Coarctation of aorta    a. s/p remote surgery.  . Essential hypertension   . Former tobacco use   . Hyperlipidemia   . Melanoma ([place]) 1990   RIGHT posterior shoulder  . Polysubstance abuse ([place])    a. former alcoholic, drug use ("all of them"). Sober for 11 years as of 2019.    Past Surgical History:  Procedure Laterality Date  . AORTA SURGERY     congenital narrowing repair  . MELANOMA EXCISION Right 1990   RIGHT posterior shoulder     Social History:  The patient  reports that he quit smoking about 3 years ago. His smoking use included cigarettes. He has a 10.00 pack-year smoking history. He uses  smokeless tobacco. He reports that he does not drink alcohol or use drugs.   Family History:  The patient's family history includes Cancer in his sister; Diabetes in his father, mother, and sister; Heart disease in his brother, father, and mother; Hyperlipidemia in his mother and sister; Hypertension in his father.    ROS:  Please see the history of present illness. All other systems are reviewed and  Negative to the above problem except as noted.    PHYSICAL EXAM: VS:  BP (!) 145/76   Pulse 67   Ht 5\' 9"  (1.753 m)   Wt 162 lb 9.6 oz (73.8 kg)   SpO2 97%   BMI 24.01 kg/m   GEN: Well nourished, well developed, in no acute distress  HEENT: normal  Neck: no JVD, carotid bruits, or masses Cardiac: RRR; no murmurs, rubs, or gallops,no edema  Respiratory:  clear to auscultation bilaterally, normal work of breathing GI: soft, nontender, nondistended, + BS  No hepatomegaly  MS: no deformity Moving all extremities   Skin: warm and dry, no rash Neuro:  Strength and sensation are intact Psych: euthymic mood,  full affect   EKG:  EKG is not  ordered today.   Lipid Panel    Component Value Date/Time   CHOL 151 11/13/2017 1026   TRIG 73 11/13/2017 1026   HDL 49 11/13/2017 1026   CHOLHDL 3.1 11/13/2017 1026   CHOLHDL 2.7 12/21/2015 0810   VLDL 11 12/21/2015 0810   LDLCALC 87 11/13/2017 1026      Wt Readings from Last 3 Encounters:  08/12/18 162 lb 9.6 oz (73.8 kg)  08/07/17 160 lb 12.8 oz (72.9 kg)  08/03/17 157 lb 12.8 oz (71.6 kg)      ASSESSMENT AND PLAN:  1  Aortic pathology   Pt is s/p repair of coarctation of the aorta   Echo last year shows no signif gradient    His aorta is dilated proximally  Will need to be followed with MRA   Plan tentatively for fall  2  CV dz   Again will need f/u    Check lpids   3  HTN   I asked him to take cuff with him to next appt to make sure that it is accurate   Continue to follow   Continue meds   Check BMET  4  HL   WIll check lipids      5  Reviewed safe practices during COVID 19 pandemic  F/U in clinic in 1 year    Current medicines are reviewed at length with the patient today.  The patient does not have concerns regarding medicines.  Signed, Dorris Carnes, MD  08/12/2018 11:12 AM    [facility] [address], [place], [place]  [address] Phone: [phone]; Fax: [phone]

## 2018-08-13 LAB — LIPID PANEL
Chol/HDL Ratio: 3.6 ratio (ref 0.0–5.0)
Cholesterol, Total: 160 mg/dL (ref 100–199)
HDL: 45 mg/dL (ref >39)
LDL Calculated: 98 mg/dL (ref 0–99)
Triglycerides: 86 mg/dL (ref 0–149)
VLDL Cholesterol Cal: 17 mg/dL (ref 5–40)

## 2018-08-13 LAB — BASIC METABOLIC PANEL
BUN/Creatinine Ratio: 23 (ref 10–24)
BUN: 20 mg/dL (ref 8–27)
CO2: 22 mmol/L (ref 20–29)
Calcium: 9 mg/dL (ref 8.6–10.2)
Chloride: 105 mmol/L (ref 96–106)
Creatinine, Ser: 0.87 mg/dL (ref 0.76–1.27)
GFR calc Af Amer: 105 mL/min/{1.73_m2} (ref >59)
GFR calc non Af Amer: 91 mL/min/{1.73_m2} (ref >59)
Glucose: 92 mg/dL (ref 65–99)
Potassium: 4.6 mmol/L (ref 3.5–5.2)
Sodium: 142 mmol/L (ref 134–144)

## 2018-08-13 LAB — CBC
Hematocrit: 37.6 % (ref 37.5–51.0)
Hemoglobin: 13.3 g/dL (ref 13.0–17.7)
MCH: 32.5 pg (ref 26.6–33.0)
MCHC: 35.4 g/dL (ref 31.5–35.7)
MCV: 92 fL (ref 79–97)
Platelets: 273 10*3/uL (ref 150–450)
RBC: 4.09 x10E6/uL — ABNORMAL LOW (ref 4.14–5.80)
RDW: 12.5 % (ref 11.6–15.4)
WBC: 7.4 10*3/uL (ref 3.4–10.8)

## 2018-08-13 LAB — PSA: Prostate Specific Ag, Serum: 0.6 ng/mL (ref 0.0–4.0)

## 2018-08-14 ENCOUNTER — Encounter (HOSPITAL_COMMUNITY): Payer: Self-pay | Admitting: *Deleted

## 2018-08-15 ENCOUNTER — Telehealth: Payer: Self-pay | Admitting: Internal Medicine

## 2018-08-15 DIAGNOSIS — E785 Hyperlipidemia, unspecified: Secondary | ICD-10-CM

## 2018-08-15 DIAGNOSIS — I779 Disorder of arteries and arterioles, unspecified: Secondary | ICD-10-CM

## 2018-08-15 DIAGNOSIS — I77810 Thoracic aortic ectasia: Secondary | ICD-10-CM

## 2018-08-15 MED ORDER — EZETIMIBE 10 MG PO TABS: 10.0000 mg | ORAL_TABLET | Freq: Every day | ORAL | 3 refills | Status: DC

## 2018-08-15 NOTE — Telephone Encounter (Signed)
Patient returned a call from our office about labs done on 06/01.

## 2018-08-15 NOTE — Telephone Encounter (Signed)
I spoke to patient and shared Dr Harrington Challenger' recommendations per lab results.  He verbalized understanding.

## 2018-08-16 ENCOUNTER — Telehealth: Payer: Self-pay | Admitting: Internal Medicine

## 2018-08-16 NOTE — Telephone Encounter (Signed)
Pt should be put in for MRA of aorta next fall (October, November )  F?U of coarctation

## 2018-08-20 ENCOUNTER — Ambulatory Visit (HOSPITAL_COMMUNITY)
Admission: RE | Admit: 2018-08-20 | Discharge: 2018-08-20 | Disposition: A | Discharge status: Home / Self Care | Payer: BLUE CROSS/BLUE SHIELD | Source: Ambulatory Visit | Attending: Internal Medicine | Admitting: Internal Medicine

## 2018-08-20 ENCOUNTER — Other Ambulatory Visit (HOSPITAL_COMMUNITY): Payer: Self-pay | Admitting: Internal Medicine

## 2018-08-20 ENCOUNTER — Other Ambulatory Visit: Payer: Self-pay

## 2018-08-20 DIAGNOSIS — E785 Hyperlipidemia, unspecified: Secondary | ICD-10-CM

## 2018-08-20 DIAGNOSIS — I779 Disorder of arteries and arterioles, unspecified: Secondary | ICD-10-CM

## 2018-08-20 DIAGNOSIS — I6523 Occlusion and stenosis of bilateral carotid arteries: Secondary | ICD-10-CM

## 2018-08-20 DIAGNOSIS — I739 Peripheral vascular disease, unspecified: Secondary | ICD-10-CM | POA: Insufficient documentation

## 2018-08-20 DIAGNOSIS — I77810 Thoracic aortic ectasia: Secondary | ICD-10-CM | POA: Diagnosis present

## 2018-08-22 MED ORDER — EZETIMIBE 10 MG PO TABS: 10.0000 mg | ORAL_TABLET | Freq: Every day | ORAL | 3 refills | Status: DC

## 2018-08-22 NOTE — Addendum Note (Signed)
Addended by: Rodman Key on: 08/22/2018 04:07 PM   Modules accepted: Orders

## 2018-08-23 ENCOUNTER — Other Ambulatory Visit: Payer: Self-pay | Admitting: *Deleted

## 2018-08-23 DIAGNOSIS — I779 Disorder of arteries and arterioles, unspecified: Secondary | ICD-10-CM

## 2018-08-26 ENCOUNTER — Other Ambulatory Visit: Payer: Self-pay | Admitting: Cardiology

## 2018-09-17 ENCOUNTER — Other Ambulatory Visit: Payer: Self-pay | Admitting: *Deleted

## 2018-09-17 DIAGNOSIS — Q251 Coarctation of aorta: Secondary | ICD-10-CM

## 2018-09-17 NOTE — Addendum Note (Signed)
Addended by: Rodman Key on: 09/17/2018 08:45 AM   Modules accepted: Orders

## 2018-09-17 NOTE — Progress Notes (Addendum)
Pt coming due for imaging of remote repair of congenital heart -coarctation.  New order placed per Dr. Harrington Challenger for October 2020.

## 2018-10-02 ENCOUNTER — Other Ambulatory Visit: Payer: Self-pay

## 2018-10-02 ENCOUNTER — Ambulatory Visit (HOSPITAL_COMMUNITY)
Admission: RE | Admit: 2018-10-02 | Discharge: 2018-10-02 | Disposition: A | Discharge status: Home / Self Care | Payer: BC Managed Care – PPO | Source: Ambulatory Visit | Attending: Internal Medicine | Admitting: Internal Medicine

## 2018-10-02 DIAGNOSIS — Q251 Coarctation of aorta: Secondary | ICD-10-CM | POA: Diagnosis not present

## 2018-10-02 MED ORDER — GADOBUTROL 1 MMOL/ML IV SOLN
7.0000 mL | Freq: Once | INTRAVENOUS | Status: AC | PRN
  Administered 2018-10-02: 7 mL via INTRAVENOUS

## 2018-10-03 ENCOUNTER — Other Ambulatory Visit: Payer: Self-pay | Admitting: Internal Medicine

## 2018-10-15 ENCOUNTER — Other Ambulatory Visit: Payer: BC Managed Care – PPO

## 2018-10-15 ENCOUNTER — Other Ambulatory Visit: Payer: Self-pay

## 2018-10-15 DIAGNOSIS — E785 Hyperlipidemia, unspecified: Secondary | ICD-10-CM

## 2018-10-15 DIAGNOSIS — I779 Disorder of arteries and arterioles, unspecified: Secondary | ICD-10-CM

## 2018-10-15 DIAGNOSIS — I77810 Thoracic aortic ectasia: Secondary | ICD-10-CM

## 2018-10-15 LAB — LIPID PANEL
Chol/HDL Ratio: 2.1 ratio (ref 0.0–5.0)
Cholesterol, Total: 101 mg/dL (ref 100–199)
HDL: 47 mg/dL (ref >39)
LDL Calculated: 44 mg/dL (ref 0–99)
Triglycerides: 51 mg/dL (ref 0–149)
VLDL Cholesterol Cal: 10 mg/dL (ref 5–40)

## 2018-10-31 ENCOUNTER — Other Ambulatory Visit: Payer: Self-pay

## 2018-10-31 ENCOUNTER — Encounter: Payer: Self-pay | Admitting: Family Medicine

## 2018-10-31 ENCOUNTER — Ambulatory Visit (INDEPENDENT_AMBULATORY_CARE_PROVIDER_SITE_OTHER): Payer: BC Managed Care – PPO | Admitting: Family Medicine

## 2018-10-31 VITALS — BP 139/69 | HR 64 | Temp 98.5°F | Resp 16 | Ht 69.0 in | Wt 159.0 lb

## 2018-10-31 DIAGNOSIS — M542 Cervicalgia: Secondary | ICD-10-CM | POA: Diagnosis not present

## 2018-10-31 MED ORDER — PREDNISONE 20 MG PO TABS: ORAL_TABLET | ORAL | 0 refills | Status: DC

## 2018-10-31 MED ORDER — HYDROCODONE-ACETAMINOPHEN 5-325 MG PO TABS: 1.0000 | ORAL_TABLET | Freq: Four times a day (QID) | ORAL | 0 refills | Status: AC | PRN

## 2018-10-31 MED ORDER — METHOCARBAMOL 500 MG PO TABS: ORAL_TABLET | ORAL | 0 refills | Status: DC

## 2018-10-31 NOTE — Patient Instructions (Signed)
Take over-the-counter ibuprofen 200 mg 4 pills 3 times daily for Aleve (naproxen) 220 mg 2 pills twice daily for anti-inflammatory effect and pain relief.  Take Robaxin (methocarbamol) 500 mg 1 morning, 1 in the afternoon, and 2 at bedtime for muscle relaxant  Take prednisone 20 mg 3 pills daily for 2 days, then 2 daily for 2 days, then 1 daily for 2 days.  Best taken in the morning after breakfast.  Take Norco 5 (hydrocodone/acetaminophen) 1 every 4-6 hours if needed for severe pain.  This medication combined hydrocodone and acetaminophen (Tylenol 325 mg).  Your maximum daily dose of acetaminophen is 3000 mg.  You can actually take some Tylenol in addition to the above medicines, but total of the total amount of acetaminophen you have gotten and do not exceed the recommended maximum dose.  Normal dose for Tylenol (acetaminophen) is 500 mg 2 pills 3 times daily.  Use ice or heat or alternate the 2 depending on what gives more relief.  If not improving get reassessed
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
## 2018-11-17 ENCOUNTER — Other Ambulatory Visit: Payer: Self-pay | Admitting: Cardiology

## 2018-12-31 ENCOUNTER — Ambulatory Visit (INDEPENDENT_AMBULATORY_CARE_PROVIDER_SITE_OTHER): Payer: BC Managed Care – PPO | Admitting: Registered Nurse

## 2018-12-31 ENCOUNTER — Encounter: Payer: Self-pay | Admitting: Gastroenterology

## 2018-12-31 ENCOUNTER — Other Ambulatory Visit: Payer: Self-pay

## 2018-12-31 ENCOUNTER — Encounter: Payer: Self-pay | Admitting: Registered Nurse

## 2018-12-31 VITALS — BP 116/66 | HR 71 | Temp 98.9°F | Resp 16 | Wt 160.0 lb

## 2018-12-31 DIAGNOSIS — R1011 Right upper quadrant pain: Secondary | ICD-10-CM | POA: Diagnosis not present

## 2018-12-31 NOTE — Patient Instructions (Signed)
° ° ° °  If you have lab work done today you will be contacted with your lab results within the next 2 weeks.  If you have not heard from us then please contact us. The fastest way to get your results is to register for My Chart. ° ° °IF you received an x-ray today, you will receive an invoice from [facility]. Please contact [facility] at [phone] with questions or concerns regarding your invoice.  ° °IF you received labwork today, you will receive an invoice from [facility]. Please contact [facility] at [phone] with questions or concerns regarding your invoice.  ° °Our billing staff will not be able to assist you with questions regarding bills from these companies. ° °You will be contacted with the lab results as soon as they are available. The fastest way to get your results is to activate your My Chart account. Instructions are located on the last page of this paperwork. If you have not heard from us regarding the results in 2 weeks, please contact this office. °  ° ° ° °

## 2018-12-31 NOTE — Progress Notes (Signed)
Acute Office Visit  Subjective:    Patient ID: Gabriel Pittman, male    DOB: 1956-03-11, 63 y.o.   MRN: [id number]  Chief Complaint  Patient presents with   Abdominal Pain    x 1 year pain has become worst more on the right side. Stabbing pain     HPI Patient is in today for abdominal pain  Onset was around 1 year ago. It has waxed and waned, but generally worsened in the preceding 1-2 months. It is located on the RUQ with some pain just right of midline extending inferior towards the umbilicus. It is a sharp and stabbing pain. Does not identify and relieving factors, but notes that engaging his abdominal muscles worsens the pain, as does increasing intraabdominal pressure. Has not sought treatment. Has not had any previous abdominal imaging or injury  Denies change to bowel or bladder habits. Social drinker, not daily. Does not associate pain with any PO intake. Denies muscle aches or pains, fatigue, changes to color or quality of stool, headache, chest pain, weakness, numbness, tingling.   Past Medical History:  Diagnosis Date   Allergy    Arthritis    Carotid artery disease ([place])    Coarctation of aorta    a. s/p remote surgery.   Essential hypertension    Former tobacco use    Hyperlipidemia    Melanoma ([place]) 1990   RIGHT posterior shoulder   Polysubstance abuse ([place])    a. former alcoholic, drug use ("all of them"). Sober for 11 years as of 2019.    Past Surgical History:  Procedure Laterality Date   AORTA SURGERY     congenital narrowing repair   MELANOMA EXCISION Right 1990   RIGHT posterior shoulder    Family History  Problem Relation Age of Onset   Heart disease Mother    Diabetes Mother    Hyperlipidemia Mother    Heart disease Father    Diabetes Father    Hypertension Father    Cancer Sister        per patient of the bone   Hyperlipidemia Sister    Diabetes Sister    Heart disease Brother    Colon cancer Neg Hx     Social  History   Socioeconomic History   Marital status: Married    Spouse name: Izora Gala   Number of children: 0   Years of education: Not on file   Highest education level: Not on file  Occupational History   Occupation: retired  Scientist, product/process development strain: Not on file   Food insecurity    Worry: Not on file    Inability: Not on Lexicographer needs    Medical: Not on file    Non-medical: Not on file  Tobacco Use   Smoking status: Former Smoker    Packs/day: 0.50    Years: 20.00    Pack years: 10.00    Types: Cigarettes    Quit date: 03/14/2015    Years since quitting: 3.8   Smokeless tobacco: Current User  Substance and Sexual Activity   Alcohol use: No    Alcohol/week: 0.0 standard drinks   Drug use: No   Sexual activity: Not on file  Lifestyle   Physical activity    Days per week: Not on file    Minutes per session: Not on file   Stress: Not on file  Relationships   Social connections    Talks on phone: Not on  file    Gets together: Not on file    Attends religious service: Not on file    Active member of club or organization: Not on file    Attends meetings of clubs or organizations: Not on file    Relationship status: Not on file   Intimate partner violence    Fear of current or ex partner: Not on file    Emotionally abused: Not on file    Physically abused: Not on file    Forced sexual activity: Not on file  Other Topics Concern   Not on file  Social History Narrative   Lives with his wife.  She has 2 children from a previous relationship.   Exercise at gym cardio and strength training 3-4 times/week    Outpatient Medications Prior to Visit  Medication Sig Dispense Refill   aspirin EC 81 MG tablet Take 1 tablet (81 mg total) by mouth daily. 90 tablet 3   ezetimibe (ZETIA) 10 MG tablet Take 1 tablet (10 mg total) by mouth daily. 90 tablet 3   lisinopril (ZESTRIL) 10 MG tablet TAKE 1 TABLET BY MOUTH EVERY DAY 90 tablet 2    Loratadine (CLARITIN PO) Take 10 mg by mouth daily.     methocarbamol (ROBAXIN) 500 MG tablet Take 1 in the morning, 1 in the afternoon, 2 at night for muscle relaxant. 40 tablet 0   predniSONE (DELTASONE) 20 MG tablet Take 3 daily for 2 days, then 2 daily for 2 days, then 1 daily for 2 days.  Best taken after breakfast. 12 tablet 0   rosuvastatin (CRESTOR) 20 MG tablet TAKE 1 TABLET BY MOUTH EVERY DAY 90 tablet 2   valACYclovir (VALTREX) 1000 MG tablet Take 1,000 mg by mouth 2 (two) times daily as needed.     No facility-administered medications prior to visit.     Allergies  Allergen Reactions   Codeine Nausea Only   Levofloxacin Hives    ROS Per hpi    Objective:    Physical Exam  Constitutional: He appears well-developed and well-nourished. No distress.  Cardiovascular: Normal rate and regular rhythm.  Pulmonary/Chest: Effort normal. No respiratory distress.  Abdominal: Soft. Normal appearance and bowel sounds are normal. He exhibits no shifting dullness, no distension, no pulsatile liver, no fluid wave, no abdominal bruit, no ascites, no pulsatile midline mass and no mass. There is hepatomegaly (? possible, difficult to assess d/t tenderness). There is no hepatosplenomegaly. There is abdominal tenderness in the right upper quadrant, epigastric area and periumbilical area. There is no rigidity, no rebound, no guarding, no CVA tenderness, no tenderness at McBurney's point and negative Murphy's sign. A hernia is present. Hernia confirmed positive in the ventral area (?).    Skin: Skin is warm and dry. No rash noted. He is not diaphoretic. No erythema. No pallor.  Psychiatric: He has a normal mood and affect. His behavior is normal. Judgment and thought content normal.  Nursing note and vitals reviewed.   BP 116/66    Pulse 71    Temp 98.9 F (37.2 C) (Oral)    Resp 16    Wt 160 lb (72.6 kg)    SpO2 97%    BMI 23.63 kg/m  Wt Readings from Last 3 Encounters:  12/31/18  160 lb (72.6 kg)  10/31/18 159 lb (72.1 kg)  08/12/18 162 lb 9.6 oz (73.8 kg)    Health Maintenance Due  Topic Date Due   HIV Screening  11/25/1968   PNA vac  Low Risk Adult (1 of 2 - PCV13) 11/26/2018    There are no preventive care reminders to display for this patient.   No results found for: TSH Lab Results  Component Value Date   WBC 7.4 08/12/2018   HGB 13.3 08/12/2018   HCT 37.6 08/12/2018   MCV 92 08/12/2018   PLT 273 08/12/2018   Lab Results  Component Value Date   NA 142 08/12/2018   K 4.6 08/12/2018   CO2 22 08/12/2018   GLUCOSE 92 08/12/2018   BUN 20 08/12/2018   CREATININE 0.87 08/12/2018   BILITOT 0.5 11/13/2017   ALKPHOS 55 11/13/2017   AST 24 11/13/2017   ALT 23 11/13/2017   PROT 6.5 11/13/2017   ALBUMIN 4.4 11/13/2017   CALCIUM 9.0 08/12/2018   Lab Results  Component Value Date   CHOL 101 10/15/2018   Lab Results  Component Value Date   HDL 47 10/15/2018   Lab Results  Component Value Date   LDLCALC 44 10/15/2018   Lab Results  Component Value Date   TRIG 51 10/15/2018   Lab Results  Component Value Date   CHOLHDL 2.1 10/15/2018   No results found for: HGBA1C     Assessment & Plan:   Problem List Items Addressed This Visit    None    Visit Diagnoses    RUQ pain    -  Primary   Relevant Orders   CMP14+EGFR   Amylase   Lipase   Ambulatory referral to Gastroenterology   Right upper quadrant pain       Relevant Orders   CT Abdomen Pelvis W Contrast       No orders of the defined types were placed in this encounter.  PLAN  Refer to GI  Abd CT to rule out hepatomegaly, fatty liver, masses in abdomen  Labs to investigate hepatic, biliary, or pancreatic etiologies  Patient encouraged to call clinic with any questions, comments, or concerns.    Maximiano Coss, NP

## 2019-01-01 ENCOUNTER — Encounter: Payer: Self-pay | Admitting: Registered Nurse

## 2019-01-01 LAB — CMP14+EGFR
ALT: 21 IU/L (ref 0–44)
AST: 22 IU/L (ref 0–40)
Albumin/Globulin Ratio: 2 (ref 1.2–2.2)
Albumin: 4.3 g/dL (ref 3.8–4.8)
Alkaline Phosphatase: 58 IU/L (ref 39–117)
BUN/Creatinine Ratio: 19 (ref 10–24)
BUN: 17 mg/dL (ref 8–27)
Bilirubin Total: 0.3 mg/dL (ref 0.0–1.2)
CO2: 23 mmol/L (ref 20–29)
Calcium: 9.1 mg/dL (ref 8.6–10.2)
Chloride: 105 mmol/L (ref 96–106)
Creatinine, Ser: 0.91 mg/dL (ref 0.76–1.27)
GFR calc Af Amer: 102 mL/min/{1.73_m2} (ref >59)
GFR calc non Af Amer: 88 mL/min/{1.73_m2} (ref >59)
Globulin, Total: 2.2 g/dL (ref 1.5–4.5)
Glucose: 94 mg/dL (ref 65–99)
Potassium: 4.9 mmol/L (ref 3.5–5.2)
Sodium: 139 mmol/L (ref 134–144)
Total Protein: 6.5 g/dL (ref 6.0–8.5)

## 2019-01-01 LAB — AMYLASE: Amylase: 86 U/L (ref 31–110)

## 2019-01-01 LAB — LIPASE: Lipase: 79 U/L — ABNORMAL HIGH (ref 13–78)

## 2019-01-01 NOTE — Progress Notes (Signed)
Results back, no concerns at this time.  Letter sent to patient via MyChart Abd pain likely ventral hernia? But pt will follow up with GI, has appt 02/04/19  Kathrin Ruddy, NP

## 2019-01-09 ENCOUNTER — Other Ambulatory Visit: Payer: Self-pay

## 2019-01-09 ENCOUNTER — Ambulatory Visit
Admission: RE | Admit: 2019-01-09 | Discharge: 2019-01-09 | Disposition: A | Discharge status: Home / Self Care | Payer: BC Managed Care – PPO | Source: Ambulatory Visit | Attending: Registered Nurse | Admitting: Registered Nurse

## 2019-01-09 ENCOUNTER — Encounter: Payer: Self-pay | Admitting: Registered Nurse

## 2019-01-09 DIAGNOSIS — R1011 Right upper quadrant pain: Secondary | ICD-10-CM

## 2019-01-09 MED ORDER — IOPAMIDOL (ISOVUE-300) INJECTION 61%
100.0000 mL | Freq: Once | INTRAVENOUS | Status: AC | PRN
  Administered 2019-01-09: 100 mL via INTRAVENOUS

## 2019-01-09 NOTE — Progress Notes (Signed)
CT results returned Wnl, no acute findings  Kathrin Ruddy, NP

## 2019-02-04 ENCOUNTER — Other Ambulatory Visit: Payer: Self-pay

## 2019-02-04 ENCOUNTER — Encounter: Payer: Self-pay | Admitting: Gastroenterology

## 2019-02-04 ENCOUNTER — Ambulatory Visit: Payer: BC Managed Care – PPO | Admitting: Gastroenterology

## 2019-02-04 VITALS — BP 120/60 | HR 68 | Temp 97.9°F | Ht 69.0 in | Wt 162.0 lb

## 2019-02-04 DIAGNOSIS — R1011 Right upper quadrant pain: Secondary | ICD-10-CM | POA: Diagnosis not present

## 2019-02-04 NOTE — Patient Instructions (Signed)
Call back if you have any more problems.

## 2019-02-04 NOTE — Progress Notes (Signed)
Review of pertinent gastrointestinal problems: 1.  Personal history of adenomatous colon polyps: colonoscopy October 2016 Dr. Ardis Hughs for routine risk colon cancer screening found 3 subcentimeter polyps.  2 of them were adenomatous, one was hyperplastic.  Original recall was recommended at 5-year interval  HPI: This is a very pleasant 63 year old man who was referred to me by Desiree Lucy*  to evaluate right upper quadrant pain.    Chief complaint is right upper quadrant pain  For about a year he has had intermittent, daily right upper quadrant pains.  These pains are very positional but are not at all related to eating or moving his bowels.  He has not had any weight loss or weight gain.  Lifting heavy objects tend to bring the pain on.  Pain will last just a brief bit of time until he repositions his body.  He takes Advil only rarely  He has no GERD  Old Data Reviewed: CT scan abdomen pelvis with IV and oral contrast October 2020 for "right upper quadrant pain" findings:  "No acute findings".    Blood work October [id number] complete metabolic profile was normal, lipase was very slightly elevated at 79 (upper limit normal 78), normal amylase  Review of systems: Pertinent positive and negative review of systems were noted in the above HPI section. All other review negative.   Past Medical History:  Diagnosis Date  . Allergy   . Arthritis   . Carotid artery disease ([place])   . Coarctation of aorta    a. s/p remote surgery.  . Essential hypertension   . Former tobacco use   . Hyperlipidemia   . Melanoma ([place]) 1990   RIGHT posterior shoulder  . Polysubstance abuse ([place])    a. former alcoholic, drug use ("all of them"). Sober for 11 years as of 2019.  . Tubular adenoma of colon 12/2014    Past Surgical History:  Procedure Laterality Date  . AORTA SURGERY     congenital narrowing repair  . MELANOMA EXCISION Right 1990   RIGHT posterior shoulder    Current Outpatient  Medications  Medication Sig Dispense Refill  . aspirin EC 81 MG tablet Take 1 tablet (81 mg total) by mouth daily. 90 tablet 3  . ezetimibe (ZETIA) 10 MG tablet Take 1 tablet (10 mg total) by mouth daily. 90 tablet 3  . lisinopril (ZESTRIL) 10 MG tablet TAKE 1 TABLET BY MOUTH EVERY DAY 90 tablet 2  . Loratadine (CLARITIN PO) Take 10 mg by mouth daily.    . rosuvastatin (CRESTOR) 20 MG tablet TAKE 1 TABLET BY MOUTH EVERY DAY 90 tablet 2  . valACYclovir (VALTREX) 1000 MG tablet Take 1,000 mg by mouth 2 (two) times daily as needed.     No current facility-administered medications for this visit.     Allergies as of 02/04/2019 - Review Complete 02/04/2019  Allergen Reaction Noted  . Codeine Nausea Only 07/26/2016  . Levofloxacin Hives 07/01/2011    Family History  Problem Relation Age of Onset  . Heart disease Mother   . Diabetes Mother   . Hyperlipidemia Mother   . Heart disease Father   . Diabetes Father   . Hypertension Father   . Cancer Sister        per patient of the bone  . Hyperlipidemia Sister   . Diabetes Sister   . Heart disease Brother   . Colon cancer Neg Hx     Social History   Socioeconomic History  . Marital  status: Married    Spouse name: Izora Gala  . Number of children: 0  . Years of education: Not on file  . Highest education level: Not on file  Occupational History  . Occupation: retired  Scientific laboratory technician  . Financial resource strain: Not on file  . Food insecurity    Worry: Not on file    Inability: Not on file  . Transportation needs    Medical: Not on file    Non-medical: Not on file  Tobacco Use  . Smoking status: Former Smoker    Packs/day: 0.50    Years: 20.00    Pack years: 10.00    Types: Cigarettes    Quit date: 03/14/2015    Years since quitting: 3.8  . Smokeless tobacco: Former Network engineer and Sexual Activity  . Alcohol use: No    Alcohol/week: 0.0 standard drinks  . Drug use: No  . Sexual activity: Not on file  Lifestyle  .  Physical activity    Days per week: Not on file    Minutes per session: Not on file  . Stress: Not on file  Relationships  . Social Herbalist on phone: Not on file    Gets together: Not on file    Attends religious service: Not on file    Active member of club or organization: Not on file    Attends meetings of clubs or organizations: Not on file    Relationship status: Not on file  . Intimate partner violence    Fear of current or ex partner: Not on file    Emotionally abused: Not on file    Physically abused: Not on file    Forced sexual activity: Not on file  Other Topics Concern  . Not on file  Social History Narrative   Lives with his wife.  She has 2 children from a previous relationship.   Exercise at gym cardio and strength training 3-4 times/week     Physical Exam: BP 120/60   Pulse 68   Temp 97.9 F (36.6 C)   Ht 5\' 9"  (1.753 m)   Wt 162 lb (73.5 kg)   BMI 23.92 kg/m  Constitutional: generally well-appearing Psychiatric: alert and oriented x3 Eyes: extraocular movements intact Mouth: oral pharynx moist, no lesions Neck: supple no lymphadenopathy Cardiovascular: heart regular rate and rhythm Lungs: clear to auscultation bilaterally Abdomen: soft, nontender, nondistended, no obvious ascites, no peritoneal signs, normal bowel sounds Extremities: no lower extremity edema bilaterally Skin: no lesions on visible extremities   Assessment and plan: 63 y.o. male with right upper quadrant pain  I do not think that his pain is related to his GI tract.  It is very positional both in onset and relieving.  CT scan was essentially normal.  I suspect this is musculoskeletal issue.  I did offer as next test if he were so inclined would be a right upper quadrant ultrasound to check to see if he actually has gallstones in his gallbladder.  CAT scan is not a good enough test to rule out cholelithiasis generally.  If he did have cholelithiasis proven by ultrasound  however I am not sure that the nature of his pain and the characteristics of his pain would be enough for a surgeon to commit to cholecystectomy.  In the end he is going to see how he does over the next several months and if the pains worsen he may call to consider ultrasound.    Please see  the "Patient Instructions" section for addition details about the plan.   Owens Loffler, MD [facility] 02/04/2019, 10:23 AM  Cc: McVey, Letta Median*

## 2019-04-25 ENCOUNTER — Ambulatory Visit: Payer: BC Managed Care – PPO | Attending: Internal Medicine

## 2019-04-25 DIAGNOSIS — Z23 Encounter for immunization: Secondary | ICD-10-CM | POA: Insufficient documentation

## 2019-04-25 NOTE — Progress Notes (Signed)
   Covid-19 Vaccination Clinic  Name:  Gabriel Pittman    MRN: [id number] DOB: 01-May-1955  04/25/2019  Gabriel Pittman was observed post Covid-19 immunization for 15 minutes without incidence. He was provided with Vaccine Information Sheet and instruction to access the V-Safe system.   Gabriel Pittman was instructed to call 911 with any severe reactions post vaccine: Marland Kitchen Difficulty breathing  . Swelling of your face and throat  . A fast heartbeat  . A bad rash all over your body  . Dizziness and weakness    Immunizations Administered    Name Date Dose VIS Date Route   Pfizer COVID-19 Vaccine 04/25/2019  3:56 PM 0.3 mL 02/21/2019 Intramuscular   Manufacturer: [place]   Lot: [id number]   [place]: [id number]

## 2019-05-18 ENCOUNTER — Ambulatory Visit: Payer: BC Managed Care – PPO | Attending: Internal Medicine

## 2019-05-18 DIAGNOSIS — Z23 Encounter for immunization: Secondary | ICD-10-CM | POA: Insufficient documentation

## 2019-05-18 NOTE — Progress Notes (Signed)
   Covid-19 Vaccination Clinic  Name:  Gabriel Pittman    MRN: [id number] DOB: 12-05-1955  05/18/2019  Gabriel Pittman was observed post Covid-19 immunization for 15 minutes without incident. He was provided with Vaccine Information Sheet and instruction to access the V-Safe system.   Gabriel Pittman was instructed to call 911 with any severe reactions post vaccine: Marland Kitchen Difficulty breathing  . Swelling of face and throat  . A fast heartbeat  . A bad rash all over body  . Dizziness and weakness   Immunizations Administered    Name Date Dose VIS Date Route   Pfizer COVID-19 Vaccine 05/18/2019 11:06 AM 0.3 mL 02/21/2019 Intramuscular   Manufacturer: [place]   Lot: [id number]   [place]: [id number]

## 2019-06-25 ENCOUNTER — Other Ambulatory Visit: Payer: Self-pay | Admitting: Internal Medicine

## 2019-08-05 ENCOUNTER — Other Ambulatory Visit: Payer: Self-pay

## 2019-08-05 MED ORDER — ROSUVASTATIN CALCIUM 20 MG PO TABS: 20.0000 mg | ORAL_TABLET | Freq: Every day | ORAL | 0 refills | Status: DC

## 2019-08-20 ENCOUNTER — Ambulatory Visit (HOSPITAL_COMMUNITY)
Admission: RE | Admit: 2019-08-20 | Discharge: 2019-08-20 | Disposition: A | Discharge status: Home / Self Care | Payer: Medicare Other | Source: Ambulatory Visit | Attending: Internal Medicine | Admitting: Internal Medicine

## 2019-08-20 ENCOUNTER — Other Ambulatory Visit: Payer: Self-pay

## 2019-08-20 ENCOUNTER — Other Ambulatory Visit (HOSPITAL_COMMUNITY): Payer: Self-pay | Admitting: Internal Medicine

## 2019-08-20 DIAGNOSIS — I6523 Occlusion and stenosis of bilateral carotid arteries: Secondary | ICD-10-CM

## 2019-09-08 ENCOUNTER — Ambulatory Visit: Payer: Medicare Other | Admitting: Internal Medicine

## 2019-09-08 ENCOUNTER — Encounter: Payer: Self-pay | Admitting: Internal Medicine

## 2019-09-08 ENCOUNTER — Other Ambulatory Visit: Payer: Self-pay

## 2019-09-08 VITALS — BP 130/76 | HR 61 | Ht 69.0 in | Wt 162.8 lb

## 2019-09-08 DIAGNOSIS — Q251 Coarctation of aorta: Secondary | ICD-10-CM | POA: Diagnosis not present

## 2019-09-08 NOTE — Progress Notes (Signed)
Cardiology Office Note   Date:  09/08/2019   ID:  DARRNELL MANGIARACINA, DOB 07-06-1955, MRN [id number]  PCP:  Dorise Hiss, PA-C  Cardiologist:   Dorris Carnes, MD    F/U of HTN and  Aortic pathology     History of Present Illness: Gabriel Pittman is a 64 y.o. male with a history of coarctation of the aorta (s/p remote repair), HTN, MVP  CV dz, polycustance abuse    MRA of the chest done in 07/24/17  Noted below   Plan for continued follow up in future  Pateint says he has been doing good   No Cp  No dizziness  No SOB   Active   Goest to gym 3x per week   Brisk walking      Current Meds  Medication Sig  . aspirin EC 81 MG tablet Take 1 tablet (81 mg total) by mouth daily.  Marland Kitchen ezetimibe (ZETIA) 10 MG tablet Take 1 tablet (10 mg total) by mouth daily.  Marland Kitchen lisinopril (ZESTRIL) 10 MG tablet Take 1 tablet (10 mg total) by mouth daily. Please make yearly appt with Dr. Harrington Challenger for June for future refills. 1st attempt  . Loratadine (CLARITIN PO) Take 10 mg by mouth as needed.   . rosuvastatin (CRESTOR) 20 MG tablet Take 1 tablet (20 mg total) by mouth daily.  . valACYclovir (VALTREX) 1000 MG tablet Take 1,000 mg by mouth 2 (two) times daily as needed.     Allergies:   Codeine and Levofloxacin   Past Medical History:  Diagnosis Date  . Allergy   . Arthritis   . Carotid artery disease ([place])   . Coarctation of aorta    a. s/p remote surgery.  . Essential hypertension   . Former tobacco use   . Hyperlipidemia   . Melanoma ([place]) 1990   RIGHT posterior shoulder  . Polysubstance abuse ([place])    a. former alcoholic, drug use ("all of them"). Sober for 11 years as of 2019.  . Tubular adenoma of colon 12/2014    Past Surgical History:  Procedure Laterality Date  . AORTA SURGERY     congenital narrowing repair  . MELANOMA EXCISION Right 1990   RIGHT posterior shoulder     Social History:  The patient  reports that he quit smoking about 4 years ago. His smoking use included  cigarettes. He has a 10.00 pack-year smoking history. He has quit using smokeless tobacco. He reports that he does not drink alcohol and does not use drugs.   Family History:  The patient's family history includes Cancer in his sister; Diabetes in his father, mother, and sister; Heart disease in his brother, father, and mother; Hyperlipidemia in his mother and sister; Hypertension in his father.    ROS:  Please see the history of present illness. All other systems are reviewed and  Negative to the above problem except as noted.    PHYSICAL EXAM: VS:  BP 130/76   Pulse 61   Ht 5\' 9"  (1.753 m)   Wt 162 lb 12.8 oz (73.8 kg)   SpO2 95%   BMI 24.04 kg/m   GEN: Well nourished, well developed, in no acute distress  HEENT: normal  Neck: no JVD or carotid bruits Cardiac: RRR; no murmurs, rubs, or gallops,no edema  Respiratory:  clear to auscultation bilaterally, normal work of breathing GI: soft, nontender, nondistended, + BS  No hepatomegaly  MS: no deformity Moving all extremities   Skin: warm and  dry, no rash Neuro:  Strength and sensation are intact Psych: euthymic mood, full affect   EKG:  EKG is not  ordered today.    Lipid Panel    Component Value Date/Time   CHOL 101 10/15/2018 0945   TRIG 51 10/15/2018 0945   HDL 47 10/15/2018 0945   CHOLHDL 2.1 10/15/2018 0945   CHOLHDL 2.7 12/21/2015 0810   VLDL 11 12/21/2015 0810   LDLCALC 44 10/15/2018 0945      Wt Readings from Last 3 Encounters:  09/08/19 162 lb 12.8 oz (73.8 kg)  02/04/19 162 lb (73.5 kg)  12/31/18 160 lb (72.6 kg)      ASSESSMENT AND PLAN:  1  Aortic pathology   Pt is s/p repair of coarctation of the aorta     Will plan of follow up MRI next winter   2  CV dz   Again will need f/u    Cont to rx lipoids      3  HTN   Pt says BP is better at home   It is not bad today   Follow    4  HL   Continue crestor    F/U in 1 year   Current medicines are reviewed at length with the patient today.  The  patient does not have concerns regarding medicines.  Signed, Dorris Carnes, MD  09/08/2019 9:45 AM    [address], [place], [place]  [address] Phone: [phone]; Fax: [phone]

## 2019-09-08 NOTE — Patient Instructions (Addendum)
Medication Instructions:  No changes *If you need a refill on your cardiac medications before your next appointment, please call your pharmacy*   Lab Work: None  Testing/Procedures: MRA chest for follow up aortic disease--DUE IN November 2021   Follow-Up: At [facility], you and your health needs are our priority.  As part of our continuing mission to provide you with exceptional heart care, we have created designated Provider Care Teams.  These Care Teams include your primary Cardiologist (physician) and Advanced Practice Providers (APPs -  Physician Assistants and Nurse Practitioners) who all work together to provide you with the care you need, when you need it.  Your next appointment:   12 month(s)  The format for your next appointment:   In Person  Provider:   You may see Dorris Carnes, MD or one of the following Advanced Practice Providers on your designated Care Team:    Richardson Dopp, PA-C  Robbie Lis, [place]    Other Instructions

## 2019-09-18 ENCOUNTER — Other Ambulatory Visit: Payer: Self-pay | Admitting: Internal Medicine

## 2019-10-27 ENCOUNTER — Other Ambulatory Visit: Payer: Self-pay | Admitting: Internal Medicine

## 2019-11-05 ENCOUNTER — Other Ambulatory Visit: Payer: Self-pay | Admitting: Internal Medicine

## 2019-12-12 ENCOUNTER — Ambulatory Visit (INDEPENDENT_AMBULATORY_CARE_PROVIDER_SITE_OTHER): Payer: Medicare Other | Admitting: Family Medicine

## 2019-12-12 ENCOUNTER — Other Ambulatory Visit: Payer: Self-pay

## 2019-12-12 ENCOUNTER — Encounter: Payer: Self-pay | Admitting: Family Medicine

## 2019-12-12 VITALS — BP 118/72 | HR 60 | Temp 97.6°F | Ht 69.0 in | Wt 160.2 lb

## 2019-12-12 DIAGNOSIS — Z23 Encounter for immunization: Secondary | ICD-10-CM | POA: Diagnosis not present

## 2019-12-12 DIAGNOSIS — R0989 Other specified symptoms and signs involving the circulatory and respiratory systems: Secondary | ICD-10-CM

## 2019-12-12 DIAGNOSIS — Z8619 Personal history of other infectious and parasitic diseases: Secondary | ICD-10-CM

## 2019-12-12 DIAGNOSIS — I10 Essential (primary) hypertension: Secondary | ICD-10-CM

## 2019-12-12 DIAGNOSIS — Z8774 Personal history of (corrected) congenital malformations of heart and circulatory system: Secondary | ICD-10-CM | POA: Diagnosis not present

## 2019-12-12 DIAGNOSIS — Z0001 Encounter for general adult medical examination with abnormal findings: Secondary | ICD-10-CM

## 2019-12-12 DIAGNOSIS — Z0000 Encounter for general adult medical examination without abnormal findings: Secondary | ICD-10-CM

## 2019-12-12 DIAGNOSIS — E7849 Other hyperlipidemia: Secondary | ICD-10-CM

## 2019-12-12 DIAGNOSIS — H9313 Tinnitus, bilateral: Secondary | ICD-10-CM

## 2019-12-12 MED ORDER — ROSUVASTATIN CALCIUM 20 MG PO TABS: 20.0000 mg | ORAL_TABLET | Freq: Every day | ORAL | 3 refills | Status: DC

## 2019-12-12 MED ORDER — VALACYCLOVIR HCL 1 G PO TABS: 1000.0000 mg | ORAL_TABLET | Freq: Two times a day (BID) | ORAL | 3 refills | Status: DC | PRN

## 2019-12-12 MED ORDER — EZETIMIBE 10 MG PO TABS: 10.0000 mg | ORAL_TABLET | Freq: Every day | ORAL | 2 refills | Status: DC

## 2019-12-12 MED ORDER — LISINOPRIL 10 MG PO TABS: 10.0000 mg | ORAL_TABLET | Freq: Every day | ORAL | 3 refills | Status: DC

## 2019-12-12 NOTE — Progress Notes (Signed)
Patient ID: Gabriel Pittman, male    DOB: 10/11/1955  Age: 64 y.o. MRN: [id number]  Chief Complaint  Patient presents with  . Annual Exam    Subjective:   64-year-old man who is here for his annual physical examination.  He has no major acute complaints.  Past medical history: Previous surgeries: Coarctation of the aorta repair at 64 years of age done in [place]. Malignant melanoma 1991 Hospitalizations: None Medications: Aspirin, Zetia, lisinopril, rosuvastatin, and as needed Valtrex Allergies: Codeine causes nausea and levofloxacin caused hives Sees the cardiologist Dr. Ross annually and gets a MRI of his aorta every couple of years which still has some stress residual narrowing.  Has his carotid arteries followed also.  Sees a dermatologist every couple of years.  Family history: Father died at 57 of heart disease.  Mother died at 85 of old age.  Family history of congestive heart failure and some siblings.  His sister had diabetes, is not living any longer.  Social history: Retired, worked at [employer].  His wife just retired.  They travel.  He does some community work.  He gets regular exercise at the gym.  He does not smoke.  Drinks an occasional beer.  Smokes marijuana about 2 times per week.  Enjoys hiking.  Considers himself a spiritual person although no longer church is tender.  He is married, has stepchildren.  Review of systems: Constitutional: Unremarkable HEENT: Has bilateral tinnitus. Respiratory: Unremarkable Cardiovascular: No problems even though he has the history of the coarctation of aorta repair and bilateral carotid bruits with some low-grade occlusions. GI: Unremarkable GU: Unremarkable Nocturia x2 Osseous skeletal: Aches and pains   Current allergies, medications, problem list, past/family and social histories reviewed.  Objective:  BP 118/72 (BP Location: Right Arm, Patient Position: Sitting, Cuff Size: Normal)   Pulse 60   Temp 97.6 F (36.4 C)  (Temporal)   Ht 5' 9" (1.753 m)   Wt 160 lb 3.2 oz (72.7 kg)   SpO2 97%   BMI 23.66 kg/m   Well-developed well-nourished male in no acute distress.  TMs normal.  Eyes PERRL.  Fundi not visualized.  Throat clear.  Teeth good.  Neck supple without nodes or thyromegaly.  Bilateral carotid bruits symmetrical, probably radicular.  Chest clear.  Heart has very soft systolic murmur.  Abdomen soft without mass or tenderness.  Normal male external genitalia, uncircumcised.  Testes descended.  No lesions.  No hernias.  Extremities unremarkable.  Skin unremarkable.  Assessment & Plan:   Assessment: 1. Annual physical exam   2. Need for prophylactic vaccination and inoculation against influenza   3. History of aortic coarctation repair   4. Bilateral carotid bruits   5. 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 Tinnitus of both ears       Plan: See instructions.  Flu and pneumonia vaccination today.  He has had his positive Covid vaccination back in March and is planning to get a booster.   Orders Placed This Encounter  Procedures  . Flu Vaccine QUAD High Dose(Fluad)  . Pneumococcal polysaccharide vaccine 23-valent greater than or equal to 2yo subcutaneous/IM  . CBC  . CMP14+EGFR  . Lipid panel  . TSH  . PSA    No orders of the defined types were placed in this encounter.        Patient Instructions       If you have lab work done today you will be   contacted with your lab results within the next 2 weeks.  If you have not heard from [place] then please contact us. The fastest way to get your results is to register for My Chart.   IF you received an x-ray today, you will receive an invoice from [facility]. Please contact [facility] at [phone] with questions or concerns regarding your invoice.   IF you received labwork today, you will receive an invoice from [facility]. Please contact [facility] at [phone] with questions or  concerns regarding your invoice.   Our billing staff will not be able to assist you with questions regarding bills from these companies.  You will be contacted with the lab results as soon as they are available. The fastest way to get your results is to activate your My Chart account. Instructions are located on the last page of this paperwork. If you have not heard from [place] regarding the results in 2 weeks, please contact this office.        No follow-ups on file.   Ruben Reason, MD 12/12/2019

## 2019-12-12 NOTE — Patient Instructions (Addendum)
  Your physical examination is fairly unremarkable as I discussed with you.  Continue same current medications, refills have been submitted to the pharmacy.  I do encourage you to get the COVID-19 vaccine booster.  Continue regular exercise and staying active in your mind.  I encourage you to be physically, emotionally, relationally, and spiritually healthy.  Return as needed.   If you have lab work done today you will be contacted with your lab results within the next 2 weeks.  If you have not heard from [place] then please contact us. The fastest way to get your results is to register for My Chart.   IF you received an x-ray today, you will receive an invoice from [facility]. Please contact [facility] at [phone] with questions or concerns regarding your invoice.   IF you received labwork today, you will receive an invoice from [facility]. Please contact [facility] at [phone] with questions or concerns regarding your invoice.   Our billing staff will not be able to assist you with questions regarding bills from these companies.  You will be contacted with the lab results as soon as they are available. The fastest way to get your results is to activate your My Chart account. Instructions are located on the last page of this paperwork. If you have not heard from [place] regarding the results in 2 weeks, please contact this office.

## 2019-12-13 LAB — CMP14+EGFR
ALT: 19 IU/L (ref 0–44)
AST: 22 IU/L (ref 0–40)
Albumin/Globulin Ratio: 1.8 (ref 1.2–2.2)
Albumin: 4.3 g/dL (ref 3.8–4.8)
Alkaline Phosphatase: 58 IU/L (ref 44–121)
BUN/Creatinine Ratio: 17 (ref 10–24)
BUN: 18 mg/dL (ref 8–27)
Bilirubin Total: 0.4 mg/dL (ref 0.0–1.2)
CO2: 19 mmol/L — ABNORMAL LOW (ref 20–29)
Calcium: 9.3 mg/dL (ref 8.6–10.2)
Chloride: 103 mmol/L (ref 96–106)
Creatinine, Ser: 1.03 mg/dL (ref 0.76–1.27)
GFR calc Af Amer: 87 mL/min/{1.73_m2} (ref >59)
GFR calc non Af Amer: 75 mL/min/{1.73_m2} (ref >59)
Globulin, Total: 2.4 g/dL (ref 1.5–4.5)
Glucose: 98 mg/dL (ref 65–99)
Potassium: 4.5 mmol/L (ref 3.5–5.2)
Sodium: 139 mmol/L (ref 134–144)
Total Protein: 6.7 g/dL (ref 6.0–8.5)

## 2019-12-13 LAB — LIPID PANEL
Chol/HDL Ratio: 2.5 ratio (ref 0.0–5.0)
Cholesterol, Total: 113 mg/dL (ref 100–199)
HDL: 46 mg/dL (ref >39)
LDL Chol Calc (NIH): 51 mg/dL (ref 0–99)
Triglycerides: 82 mg/dL (ref 0–149)
VLDL Cholesterol Cal: 16 mg/dL (ref 5–40)

## 2019-12-13 LAB — CBC
Hematocrit: 41 % (ref 37.5–51.0)
Hemoglobin: 13.8 g/dL (ref 13.0–17.7)
MCH: 32.3 pg (ref 26.6–33.0)
MCHC: 33.7 g/dL (ref 31.5–35.7)
MCV: 96 fL (ref 79–97)
Platelets: 278 10*3/uL (ref 150–450)
RBC: 4.27 x10E6/uL (ref 4.14–5.80)
RDW: 12.7 % (ref 11.6–15.4)
WBC: 6.4 10*3/uL (ref 3.4–10.8)

## 2019-12-13 LAB — PSA: Prostate Specific Ag, Serum: 0.5 ng/mL (ref 0.0–4.0)

## 2019-12-13 LAB — TSH: TSH: 2.13 u[IU]/mL (ref 0.450–4.500)

## 2020-01-19 ENCOUNTER — Ambulatory Visit (HOSPITAL_COMMUNITY)
Admission: RE | Admit: 2020-01-19 | Discharge: 2020-01-19 | Disposition: A | Discharge status: Home / Self Care | Payer: Medicare Other | Source: Ambulatory Visit | Attending: Internal Medicine | Admitting: Internal Medicine

## 2020-01-19 ENCOUNTER — Other Ambulatory Visit: Payer: Self-pay

## 2020-01-19 DIAGNOSIS — Z8774 Personal history of (corrected) congenital malformations of heart and circulatory system: Secondary | ICD-10-CM | POA: Diagnosis present

## 2020-01-19 DIAGNOSIS — Q2543 Congenital aneurysm of aorta: Secondary | ICD-10-CM | POA: Insufficient documentation

## 2020-01-19 DIAGNOSIS — Q251 Coarctation of aorta: Secondary | ICD-10-CM

## 2020-01-19 MED ORDER — GADOBUTROL 1 MMOL/ML IV SOLN
7.0000 mL | Freq: Once | INTRAVENOUS | Status: AC | PRN
  Administered 2020-01-19: 7 mL via INTRAVENOUS

## 2020-04-22 ENCOUNTER — Encounter: Payer: Self-pay | Admitting: Gastroenterology

## 2020-06-10 ENCOUNTER — Ambulatory Visit: Payer: Medicare Other | Admitting: Podiatry

## 2020-06-10 ENCOUNTER — Ambulatory Visit (INDEPENDENT_AMBULATORY_CARE_PROVIDER_SITE_OTHER): Payer: Medicare Other

## 2020-06-10 ENCOUNTER — Other Ambulatory Visit: Payer: Self-pay

## 2020-06-10 DIAGNOSIS — M79672 Pain in left foot: Secondary | ICD-10-CM | POA: Diagnosis not present

## 2020-06-10 DIAGNOSIS — M76821 Posterior tibial tendinitis, right leg: Secondary | ICD-10-CM

## 2020-06-10 DIAGNOSIS — M778 Other enthesopathies, not elsewhere classified: Secondary | ICD-10-CM

## 2020-06-10 DIAGNOSIS — M79671 Pain in right foot: Secondary | ICD-10-CM

## 2020-06-10 MED ORDER — DICLOFENAC SODIUM 75 MG PO TBEC: 75.0000 mg | DELAYED_RELEASE_TABLET | Freq: Two times a day (BID) | ORAL | 2 refills | Status: DC

## 2020-06-10 MED ORDER — TRIAMCINOLONE ACETONIDE 10 MG/ML IJ SUSP
10.0000 mg | Freq: Once | INTRAMUSCULAR | Status: AC
  Administered 2020-06-10: 10 mg

## 2020-06-10 NOTE — Progress Notes (Signed)
Subjective:   Patient ID: Gabriel Pittman, male   DOB: 65 y.o.   MRN: [id number]   HPI Patient presents stating that he has a lot of pain in the right medial ankle and also has discomfort in the left foot that is moderate in nature.   Review of Systems  All other systems reviewed and are negative.       Objective:  Physical Exam Vitals and nursing note reviewed.  Constitutional:      Appearance: He is well-developed.  Pulmonary:     Effort: Pulmonary effort is normal.  Musculoskeletal:        General: Normal range of motion.  Skin:    General: Skin is warm.  Neurological:     Mental Status: He is alert.     Neurovascular status found to be intact muscle strength found to be adequate range of motion adequate.  Patient is noted to have quite a bit of discomfort right ankle around posterior tibial tendon also appears to have moderate indications of some nerve entrapment in the area with this condition discussed 5 years ago with moderate improvement over that period of time.  Good digital perfusion well oriented x3 adequate muscle strength noted and a moderate positive Tinel's sign right    Assessment:  Difficult to tell between posterior tibial tendinitis right versus tarsal tunnel along with what appears to be inflammatory capsulitis second MPJ left     Plan:  H&P reviewed all conditions and for the right I went ahead and I did inject the posterior tibial tendon sheath 3 mg Dexasone Kenalog 5 mg Xylocaine and for the left foot I went ahead applied padding to reduce pressure on the joint educated about capsulitis placed on oral anti-inflammatory agent and discussed rigid bottom shoes.  Reappoint to recheck  X-rays indicate no signs of fracture indicated that there is moderate depression of the arch bilateral

## 2020-08-03 ENCOUNTER — Other Ambulatory Visit: Payer: Self-pay | Admitting: Family Medicine

## 2020-08-03 DIAGNOSIS — E7849 Other hyperlipidemia: Secondary | ICD-10-CM

## 2020-08-03 NOTE — Telephone Encounter (Signed)
Requested medications are due for refill today.  yes  Requested medications are on the active medications list.  yes  Last refill. 12/12/2019  Future visit scheduled.   no  Notes to clinic.

## 2020-08-06 ENCOUNTER — Other Ambulatory Visit (HOSPITAL_COMMUNITY): Payer: Self-pay | Admitting: Internal Medicine

## 2020-08-06 DIAGNOSIS — I6523 Occlusion and stenosis of bilateral carotid arteries: Secondary | ICD-10-CM

## 2020-08-20 ENCOUNTER — Ambulatory Visit (HOSPITAL_COMMUNITY)
Admission: RE | Admit: 2020-08-20 | Discharge: 2020-08-20 | Disposition: A | Discharge status: Home / Self Care | Payer: Medicare Other | Source: Ambulatory Visit | Attending: Cardiology | Admitting: Cardiology

## 2020-08-20 ENCOUNTER — Other Ambulatory Visit (HOSPITAL_COMMUNITY): Payer: Self-pay | Admitting: Internal Medicine

## 2020-08-20 DIAGNOSIS — I6523 Occlusion and stenosis of bilateral carotid arteries: Secondary | ICD-10-CM

## 2020-09-16 ENCOUNTER — Other Ambulatory Visit: Payer: Self-pay | Admitting: Registered Nurse

## 2020-09-16 DIAGNOSIS — E7849 Other hyperlipidemia: Secondary | ICD-10-CM

## 2020-09-30 ENCOUNTER — Other Ambulatory Visit: Payer: Self-pay | Admitting: *Deleted

## 2020-09-30 DIAGNOSIS — I10 Essential (primary) hypertension: Secondary | ICD-10-CM

## 2020-09-30 MED ORDER — LISINOPRIL 10 MG PO TABS: 10.0000 mg | ORAL_TABLET | Freq: Every day | ORAL | 0 refills | Status: DC

## 2020-11-01 ENCOUNTER — Ambulatory Visit: Payer: Medicare Other | Admitting: Internal Medicine

## 2020-11-01 ENCOUNTER — Other Ambulatory Visit: Payer: Self-pay

## 2020-11-01 ENCOUNTER — Other Ambulatory Visit: Payer: Self-pay | Admitting: Podiatry

## 2020-11-01 ENCOUNTER — Encounter: Payer: Self-pay | Admitting: Internal Medicine

## 2020-11-01 VITALS — BP 128/64 | HR 54 | Ht 69.0 in | Wt 154.6 lb

## 2020-11-01 DIAGNOSIS — E785 Hyperlipidemia, unspecified: Secondary | ICD-10-CM | POA: Diagnosis not present

## 2020-11-01 DIAGNOSIS — I6523 Occlusion and stenosis of bilateral carotid arteries: Secondary | ICD-10-CM | POA: Diagnosis not present

## 2020-11-01 DIAGNOSIS — I10 Essential (primary) hypertension: Secondary | ICD-10-CM

## 2020-11-01 NOTE — Patient Instructions (Addendum)
Medication Instructions:  Your physician recommends that you continue on your current medications as directed. Please refer to the Current Medication list given to you today.  *If you need a refill on your cardiac medications before your next appointment, please call your pharmacy*  Lab Work: Your physician recommends that you have lab work today-CBC, BMET, NMR lipoprofile, Apolipoprotein, LPA  If you have labs (blood work) drawn today and your tests are completely normal, you will receive your results only by: MyChart Message (if you have MyChart) OR A paper copy in the mail If you have any lab test that is abnormal or we need to change your treatment, we will call you to review the results.  Follow-Up: At [facility], you and your health needs are our priority.  As part of our continuing mission to provide you with exceptional heart care, we have created designated Provider Care Teams.  These Care Teams include your primary Cardiologist (physician) and Advanced Practice Providers (APPs -  Physician Assistants and Nurse Practitioners) who all work together to provide you with the care you need, when you need it.  We recommend signing up for the patient portal called "MyChart".  Sign up information is provided on this After Visit Summary.  MyChart is used to connect with patients for Virtual Visits (Telemedicine).  Patients are able to view lab/test results, encounter notes, upcoming appointments, etc.  Non-urgent messages can be sent to your provider as well.   To learn more about what you can do with MyChart, go to [email or web address].    Your next appointment:   12 month(s)  The format for your next appointment:   In Person  Provider:   You may see Dorris Carnes, MD or one of the following Advanced Practice Providers on your designated Care Team:   Richardson Dopp, PA-C [place], [place]

## 2020-11-01 NOTE — Progress Notes (Signed)
Cardiology Office Note   Date:  11/04/2020   ID:  Gabriel Pittman, DOB January 08, 1956, MRN [id number]  PCP:  Maximiano Coss, NP  Cardiologist:   Dorris Carnes, MD    F/U of HTN and  Aortic pathology     History of Present Illness: Gabriel Pittman is a 65 y.o. 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  Notes occasional dizziness at night   Current Meds  Medication Sig   aspirin EC 81 MG tablet Take 1 tablet (81 mg total) by mouth daily.   ezetimibe (ZETIA) 10 MG tablet TAKE 1 TABLET BY MOUTH  DAILY   lisinopril (ZESTRIL) 10 MG tablet Take 1 tablet (10 mg total) by mouth daily.   Loratadine (CLARITIN PO) Take 10 mg by mouth as needed.    rosuvastatin (CRESTOR) 20 MG tablet Take 1 tablet (20 mg total) by mouth daily.     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 s/p remote surgery.   Essential hypertension    Former tobacco use    Hyperlipidemia    Melanoma ([place]) 1990   RIGHT posterior shoulder   Polysubstance abuse ([place])    a. former alcoholic, drug use ("all of them"). Sober for 11 years as of 2019.   Tubular adenoma of colon 12/2014    Past Surgical History:  Procedure Laterality Date   AORTA SURGERY     congenital narrowing repair   MELANOMA EXCISION Right 1990   RIGHT posterior shoulder     Social History:  The patient  reports that he quit smoking about 5 years ago. His smoking use included cigarettes. He has a 10.00 pack-year smoking history. He has quit using smokeless tobacco. He reports that he does not drink alcohol and does not use drugs.   Family History:  The patient's family history includes Cancer in his sister; Diabetes  in his father, mother, and sister; Heart disease in his brother, father, and mother; Hyperlipidemia in his mother and sister; Hypertension in his father.    ROS:  Please see the history of present illness. All other systems are reviewed and  Negative to the above problem except as noted.    PHYSICAL EXAM: VS:  BP 128/64   Pulse (!) 54   Ht '5\' 9"'$  (1.753 m)   Wt 154 lb 9.6 oz (70.1 kg)   SpO2 98%   BMI 22.83 kg/m   GEN: Well nourished, well developed, in no acute distress  HEENT: normal  Neck: no JVD or carotid bruits Cardiac: RRR; no murmurs  No LE edema  Respiratory:  clear to auscultation bilaterally,  GI: soft, nontender, nondistended, + BS  No hepatomegaly  MS: no deformity Moving all extremities   Skin: warm and dry, no rash Neuro:  Strength and sensation are intact Psych: euthymic mood, full affect   EKG:  EKG is  ordered today.   SB  54 bpm     Carotid USN   Summary:  Right Carotid: Velocities in the right ICA are consistent with a 40-59%  stenosis. The ECA appears >50% stenosed.   Left Carotid: Velocities in the left ICA are consistent with a 1-39%  stenosis.                The ECA appears >50% stenosed.   Vertebrals:  Bilateral vertebral arteries demonstrate antegrade flow.  Subclavians: Right subclavian artery was stenotic. Right subclavian artery  flow               was disturbed. Normal flow hemodynamics were seen in the left               subclavian artery.   *See table(s) above for measurements and observations.  Suggest follow up study in 12 months.  Lipid Panel    Component Value Date/Time   CHOL 113 12/12/2019 1013   TRIG 82 12/12/2019 1013   HDL 46 12/12/2019 1013   CHOLHDL 2.5 12/12/2019 1013   CHOLHDL 2.7 12/21/2015 0810   VLDL 11 12/21/2015 0810   LDLCALC 51 12/12/2019 1013      Wt Readings from Last 3 Encounters:  11/01/20 154 lb 9.6 oz (70.1 kg)  12/12/19 160 lb 3.2 oz (72.7 kg)  09/08/19 162 lb 12.8 oz (73.8 kg)       ASSESSMENT AND PLAN:  1  Aortic pathology   Pt is s/p repair of coarctation of the aorta     Will plan of follow up scan next winter   2  CV dz  CV study above   Will continue to follow up in 1 year   keep on statin     3  HTN  BP is controlled   Continue on lisinopril    4  HL   Continue crestor    Follow lipids  Will check BMET and CBC as well    F/U in 1 year   Current medicines are reviewed at length with the patient today.  The patient does not have concerns regarding medicines.  Signed, Dorris Carnes, MD  11/04/2020 6:15 AM    [facility] [address], [place], [place]  [address] Phone: [phone]; Fax: [phone]

## 2020-11-02 LAB — CBC WITH DIFFERENTIAL/PLATELET
Basophils Absolute: 0.1 10*3/uL (ref 0.0–0.2)
Basos: 1 %
EOS (ABSOLUTE): 0.1 10*3/uL (ref 0.0–0.4)
Eos: 2 %
Hematocrit: 40.5 % (ref 37.5–51.0)
Hemoglobin: 13.7 g/dL (ref 13.0–17.7)
Immature Grans (Abs): 0 10*3/uL (ref 0.0–0.1)
Immature Granulocytes: 0 %
Lymphocytes Absolute: 1.6 10*3/uL (ref 0.7–3.1)
Lymphs: 27 %
MCH: 32.2 pg (ref 26.6–33.0)
MCHC: 33.8 g/dL (ref 31.5–35.7)
MCV: 95 fL (ref 79–97)
Monocytes Absolute: 0.5 10*3/uL (ref 0.1–0.9)
Monocytes: 9 %
Neutrophils Absolute: 3.7 10*3/uL (ref 1.4–7.0)
Neutrophils: 61 %
Platelets: 265 10*3/uL (ref 150–450)
RBC: 4.25 x10E6/uL (ref 4.14–5.80)
RDW: 12.3 % (ref 11.6–15.4)
WBC: 6 10*3/uL (ref 3.4–10.8)

## 2020-11-02 LAB — LIPOPROTEIN A (LPA): Lipoprotein (a): 169.4 nmol/L — ABNORMAL HIGH (ref <75.0)

## 2020-11-02 LAB — BASIC METABOLIC PANEL
BUN/Creatinine Ratio: 17 (ref 10–24)
BUN: 16 mg/dL (ref 8–27)
CO2: 23 mmol/L (ref 20–29)
Calcium: 9.2 mg/dL (ref 8.6–10.2)
Chloride: 104 mmol/L (ref 96–106)
Creatinine, Ser: 0.92 mg/dL (ref 0.76–1.27)
Glucose: 100 mg/dL — ABNORMAL HIGH (ref 65–99)
Potassium: 4.8 mmol/L (ref 3.5–5.2)
Sodium: 139 mmol/L (ref 134–144)
eGFR: 92 mL/min/{1.73_m2} (ref >59)

## 2020-11-02 LAB — APOLIPOPROTEIN B: Apolipoprotein B: 63 mg/dL (ref <90)

## 2020-11-02 LAB — NMR, LIPOPROFILE
Cholesterol, Total: 128 mg/dL (ref 100–199)
HDL Particle Number: 37.7 umol/L (ref >=30.5)
HDL-C: 54 mg/dL (ref >39)
LDL Particle Number: 691 nmol/L (ref <1000)
LDL Size: 20.4 nm — ABNORMAL LOW (ref >20.5)
LDL-C (NIH Calc): 58 mg/dL (ref 0–99)
LP-IR Score: 33 (ref <=45)
Small LDL Particle Number: 324 nmol/L (ref <=527)
Triglycerides: 82 mg/dL (ref 0–149)

## 2020-11-02 NOTE — Telephone Encounter (Signed)
Please advise 

## 2020-12-01 ENCOUNTER — Other Ambulatory Visit: Payer: Self-pay | Admitting: Internal Medicine

## 2020-12-01 DIAGNOSIS — I10 Essential (primary) hypertension: Secondary | ICD-10-CM

## 2020-12-09 ENCOUNTER — Other Ambulatory Visit: Payer: Self-pay

## 2020-12-09 DIAGNOSIS — E7849 Other hyperlipidemia: Secondary | ICD-10-CM

## 2020-12-09 MED ORDER — ROSUVASTATIN CALCIUM 20 MG PO TABS: 20.0000 mg | ORAL_TABLET | Freq: Every day | ORAL | 3 refills | Status: DC

## 2020-12-13 ENCOUNTER — Encounter: Payer: Self-pay | Admitting: Registered Nurse

## 2020-12-30 IMAGING — MR MR MRA CHEST W/ OR W/O CM
19 series · 19 of 19 positions shown · IV contrast (Contrast agent)
Comparison: Chest MRA-09/30/2018; 07/24/2017

CLINICAL DATA: History of congenital heart disease (post repair of
aortic coarctation), now with concern for thoracic aortic aneurysm.

EXAM:
MRA CHEST WITH OR WITHOUT CONTRAST
TECHNIQUE: Angiographic images of the chest were obtained using MRA technique
with intravenous contrast.
CONTRAST:  7 mL GADAVIST GADOBUTROL 1 MMOL/ML IV SOLN

[Series 5: t2_trufi_tra_p2_bh · axial · 8.0mm · 0.62mm/px · 1 of 3 slices shown]
[im 1/3]
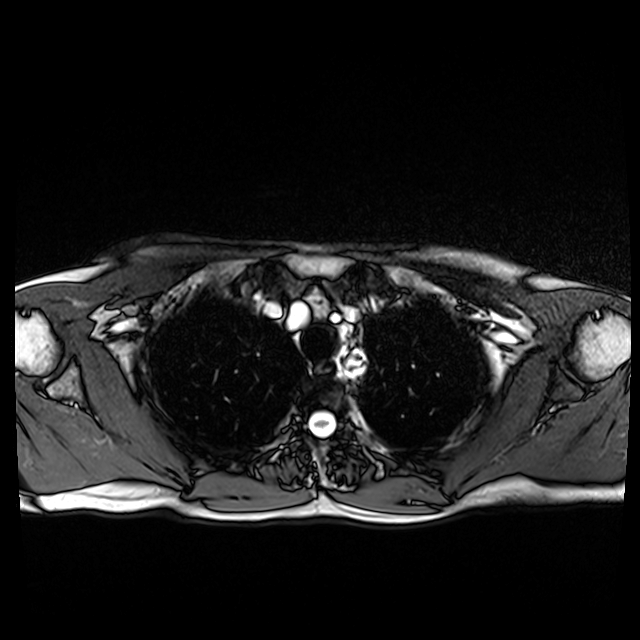

[Series 6: (person_name)_(person_name)_(person_name) · sagittal · 8.0mm · 1.79mm/px · 1 of 25 slices shown (1 of 8)]
[im 1/25]
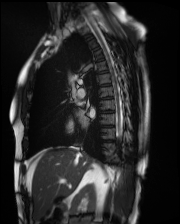

[Series 6: (person_name)_(person_name)_(person_name) · sagittal · 8.0mm · 1.79mm/px · 1 of 25 slices shown (2 of 8)]
[im 1/25]
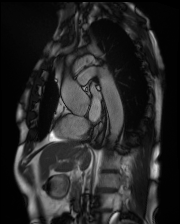

[Series 6: (person_name)_(person_name)_(person_name) · sagittal · 8.0mm · 1.79mm/px · 1 of 25 slices shown (3 of 8)]
[im 1/25]
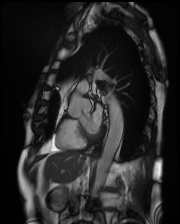

[Series 6: (person_name)_(person_name)_(person_name) · sagittal · 8.0mm · 1.79mm/px · 1 of 25 slices shown (4 of 8)]
[im 1/25]
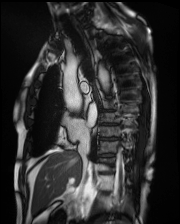

[Series 6: (person_name)_(person_name)_(person_name) · sagittal · 8.0mm · 1.79mm/px · 1 of 25 slices shown (5 of 8)]
[im 1/25]
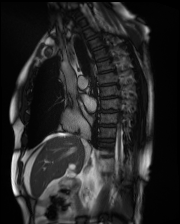

[Series 6: (person_name)_(person_name)_(person_name) · sagittal · 8.0mm · 1.79mm/px · 1 of 25 slices shown (6 of 8)]
[im 1/25]
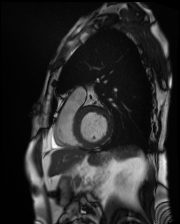

[Series 6: (person_name)_(person_name)_(person_name) · sagittal · 8.0mm · 1.79mm/px · 1 of 25 slices shown (7 of 8)]
[im 1/25]
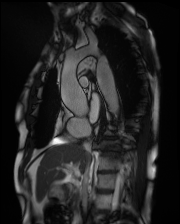

[Series 6: (person_name)_(person_name)_(person_name) · sagittal · 8.0mm · 1.79mm/px · 1 of 25 slices shown (8 of 8)]
[im 1/25]
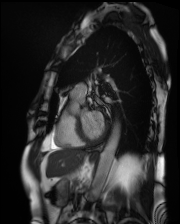

[Series 7: axial_db_haste_loc · axial · 7.0mm · 1.52mm/px · 1 of 30 slices shown]
[im 1/30]
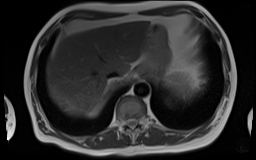

[Series 8: T1 dynamic · axial · non-contrast · 3.3mm · 1.18mm/px · 1 of 96 slices shown]
[im 1/96]
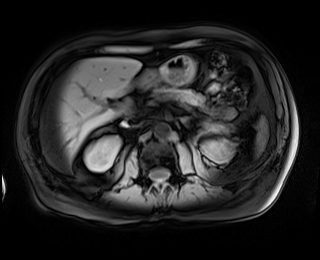

[Series 9: angio_fl3d_sag_pre · sagittal · 1.1mm · 1.17mm/px · 1 of 112 slices shown]
[im 1/112]
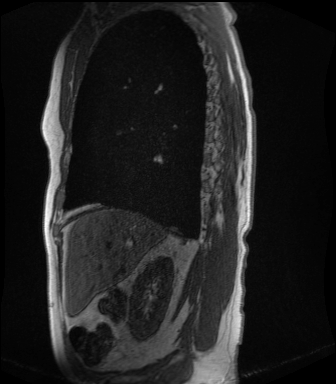

[Series 11: candy cane ce-arterial · sagittal · arterial · 1.1mm · 1.17mm/px · 1 of 112 slices shown]
[im 1/112]
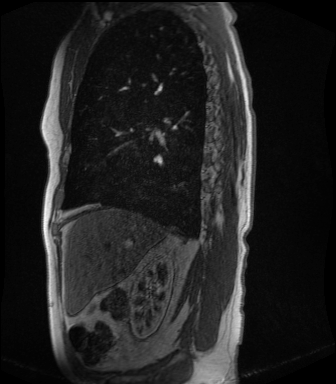

[Series 12: candy cane ce-arterial_sub · sagittal · 1.1mm · 1.17mm/px · 1 of 112 slices shown]
[im 1/112]
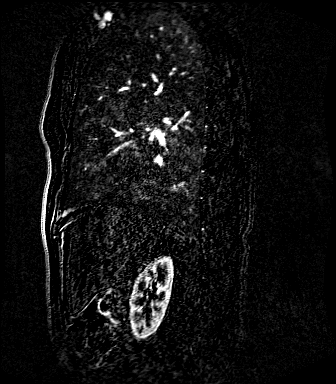

[Series 14: candy cane ce-venous · sagittal · portal-venous · 1.1mm · 1.17mm/px · 1 of 112 slices shown]
[im 1/112]
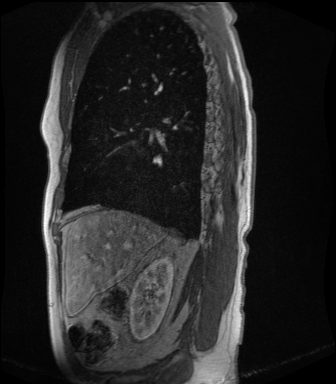

[Series 15: candy cane ce-venous_sub · sagittal · 1.1mm · 1.17mm/px · 1 of 112 slices shown]
[im 1/112]
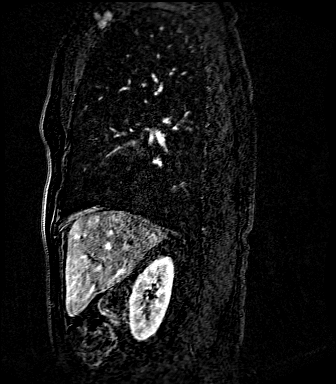

[Series 17: T1 dynamic post-contrast · axial · 3.3mm · 1.18mm/px · 1 of 96 slices shown]
[im 1/96]
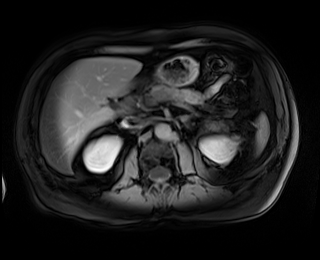

[Series 1022: mip tumble · axial · 1.1mm · 0.36mm/px · 1 of 3 slices shown]
[im 1/3]
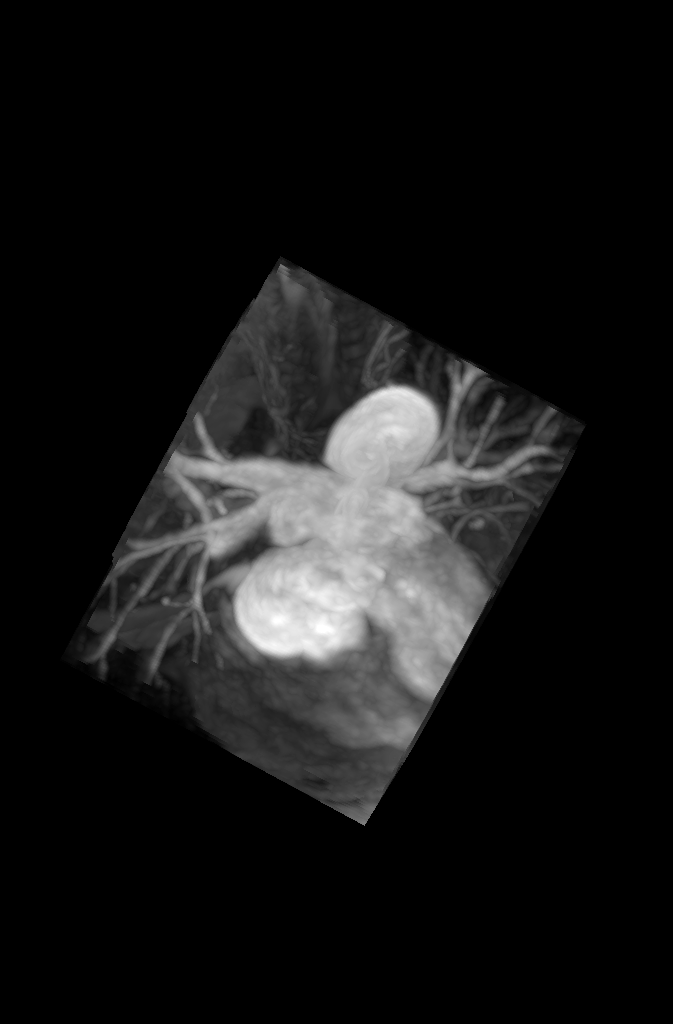

[Series 1026: mip rotate · sagittal · 1.1mm · 0.35mm/px · 1 of 6 slices shown]
[im 1/6]
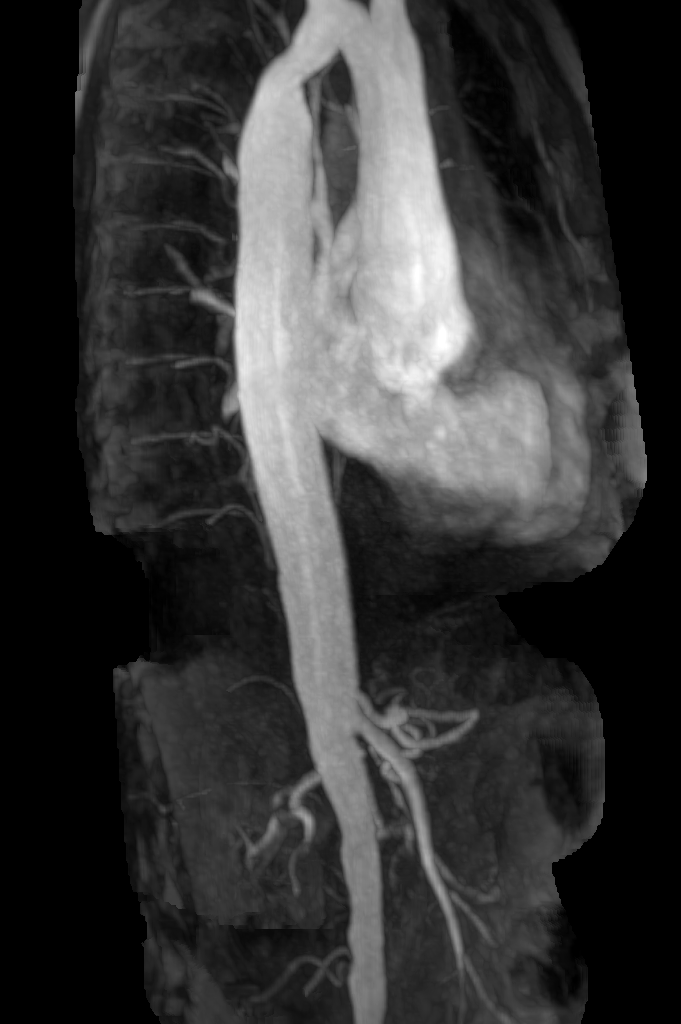

[19 of 19 positions shown; findings below may reference images not displayed]

FINDINGS: Vascular Findings:

Stable fusiform aneurysmal dilatation of the aortic root and
proximal most aspect of the ascending thoracic aorta with
measurements as follows. The thoracic aorta tapers to a normal
caliber at the mid aspect of the ascending thoracic aorta. No
evidence of thoracic aortic dissection or periaortic stranding on
this nongated examination.

Stable sequela of previous operative repair of the proximal
descending thoracic aorta compatible with provided history of
previous coarctation repair. There is unchanged minimal irregularity
about the operative site without evidence of a residual
hemodynamically significant stenosis. Specifically, there are no
definitive hypertrophied intercostal arteries.

Normal heart size.  No pericardial effusion.

Although this examination was not tailored for the evaluation the
pulmonary arteries, there are no discrete filling defects within the
central pulmonary arterial tree to suggest central pulmonary
embolism. Normal caliber the main pulmonary artery measuring 44 mm
in diameter (image 39, series 17).

-------------------------------------------------------------

Thoracic aortic measurements:

Aortic root:

47 mm in greatest oblique short axis sagittal diameter (image 10,
series 6), grossly unchanged compared to the [DATE] examination.

Sinotubular junction

41 mm as measured in greatest oblique short axis coronal dimension.

Proximal ascending aorta

Approximately 42 mm in greatest oblique short axis sagittal diameter
(image 25, series 6), as measured at the level above the
Romin junction and below the main pulmonary artery (image 25,
series 6), grossly unchanged compared to the [DATE] examination.

Approximately 35 mm as measured in greatest oblique short axis axial
dimension at the level of the main pulmonary artery (image 37,
series 17).

Aortic arch aorta

20 mm as measured in greatest oblique short axis sagittal dimension
(image 58, series 11).

Proximal descending thoracic aorta

30 mm as measured in greatest oblique short axis axial dimension at
the level of the main pulmonary artery.

Distal descending thoracic aorta

25 mm as measured in greatest oblique short axis axial dimension at
the level of the diaphragmatic hiatus.

Review of the MIP images confirms the above findings.

-------------------------------------------------------------

Non-Vascular Findings:

Mediastinum/Lymph Nodes: No bulky mediastinal, hilar axillary
lymphadenopathy

Lungs/Pleura: No discrete focal airspace opacities. No pleural
effusion.

Upper abdomen: Limited evaluation of the upper abdomen is normal.

Musculoskeletal: No acute or aggressive osseous abnormalities.
IMPRESSION: 1. Stable uncomplicated mild aneurysmal dilatation of the aortic
root (measuring 47 mm) and proximal aspect of the ascending thoracic
aorta (measuring 42 mm), grossly unchanged compared to the [DATE]
examination.
2. Stable sequela of previous coarctation repair without evidence of
a residual hemodynamically significant narrowing.

## 2020-12-30 NOTE — Progress Notes (Signed)
Set up for f/u MRI  to reeval aorta.   Last one was Nov 2021

## 2020-12-31 ENCOUNTER — Telehealth: Payer: Self-pay

## 2020-12-31 DIAGNOSIS — Z01810 Encounter for preprocedural cardiovascular examination: Secondary | ICD-10-CM

## 2020-12-31 DIAGNOSIS — Q251 Coarctation of aorta: Secondary | ICD-10-CM

## 2020-12-31 NOTE — Telephone Encounter (Signed)
-----   Message from [name], MD sent at 12/30/2020  7:16 PM EDT -----    ----- Message ----- From: [name], MD Sent: 11/04/2020   6:44 AM EDT To: [name], MD  MRI vs CT

## 2020-12-31 NOTE — Telephone Encounter (Signed)
Set up for f/u MRI  to reeval aorta.   Last one was Nov 2021  Spoke with the pt and he agrees to the MRA... he will have his BMET within 7 days of getting his appt.

## 2021-01-17 ENCOUNTER — Other Ambulatory Visit: Payer: Self-pay

## 2021-01-17 ENCOUNTER — Other Ambulatory Visit: Payer: Medicare Other | Admitting: *Deleted

## 2021-01-17 DIAGNOSIS — Z01810 Encounter for preprocedural cardiovascular examination: Secondary | ICD-10-CM

## 2021-01-17 LAB — BASIC METABOLIC PANEL
BUN/Creatinine Ratio: 18 (ref 10–24)
BUN: 18 mg/dL (ref 8–27)
CO2: 25 mmol/L (ref 20–29)
Calcium: 9 mg/dL (ref 8.6–10.2)
Chloride: 106 mmol/L (ref 96–106)
Creatinine, Ser: 0.98 mg/dL (ref 0.76–1.27)
Glucose: 89 mg/dL (ref 70–99)
Potassium: 5.3 mmol/L — ABNORMAL HIGH (ref 3.5–5.2)
Sodium: 140 mmol/L (ref 134–144)
eGFR: 85 mL/min/{1.73_m2} (ref >59)

## 2021-01-24 ENCOUNTER — Other Ambulatory Visit: Payer: Self-pay

## 2021-01-24 ENCOUNTER — Ambulatory Visit (HOSPITAL_COMMUNITY)
Admission: RE | Admit: 2021-01-24 | Discharge: 2021-01-24 | Disposition: A | Discharge status: Home / Self Care | Payer: Medicare Other | Source: Ambulatory Visit | Attending: Internal Medicine | Admitting: Internal Medicine

## 2021-01-24 DIAGNOSIS — I719 Aortic aneurysm of unspecified site, without rupture: Secondary | ICD-10-CM | POA: Insufficient documentation

## 2021-01-24 DIAGNOSIS — Z9889 Other specified postprocedural states: Secondary | ICD-10-CM | POA: Diagnosis not present

## 2021-01-24 DIAGNOSIS — I7121 Aneurysm of the ascending aorta, without rupture: Secondary | ICD-10-CM | POA: Diagnosis not present

## 2021-01-24 DIAGNOSIS — Q251 Coarctation of aorta: Secondary | ICD-10-CM | POA: Insufficient documentation

## 2021-01-24 MED ORDER — GADOBUTROL 1 MMOL/ML IV SOLN
7.0000 mL | Freq: Once | INTRAVENOUS | Status: AC | PRN
  Administered 2021-01-24: 7 mL via INTRAVENOUS

## 2021-01-31 ENCOUNTER — Telehealth: Payer: Self-pay | Admitting: Internal Medicine

## 2021-01-31 NOTE — Telephone Encounter (Signed)
Pt is calling back for MRI test results

## 2021-01-31 NOTE — Telephone Encounter (Signed)
The patient has been notified of the result and verbalized understanding.  All questions (if any) were answered. Rodman Key, RN 01/31/2021 1:49 PM

## 2021-04-13 DIAGNOSIS — M7062 Trochanteric bursitis, left hip: Secondary | ICD-10-CM | POA: Diagnosis not present

## 2021-04-13 DIAGNOSIS — M25552 Pain in left hip: Secondary | ICD-10-CM | POA: Diagnosis not present

## 2021-04-21 DIAGNOSIS — M25652 Stiffness of left hip, not elsewhere classified: Secondary | ICD-10-CM | POA: Diagnosis not present

## 2021-04-21 DIAGNOSIS — M25552 Pain in left hip: Secondary | ICD-10-CM | POA: Diagnosis not present

## 2021-04-21 DIAGNOSIS — M6281 Muscle weakness (generalized): Secondary | ICD-10-CM | POA: Diagnosis not present

## 2021-04-26 DIAGNOSIS — M25552 Pain in left hip: Secondary | ICD-10-CM | POA: Diagnosis not present

## 2021-04-26 DIAGNOSIS — M25652 Stiffness of left hip, not elsewhere classified: Secondary | ICD-10-CM | POA: Diagnosis not present

## 2021-04-26 DIAGNOSIS — M6281 Muscle weakness (generalized): Secondary | ICD-10-CM | POA: Diagnosis not present

## 2021-04-28 DIAGNOSIS — M25552 Pain in left hip: Secondary | ICD-10-CM | POA: Diagnosis not present

## 2021-04-28 DIAGNOSIS — M25652 Stiffness of left hip, not elsewhere classified: Secondary | ICD-10-CM | POA: Diagnosis not present

## 2021-04-28 DIAGNOSIS — M6281 Muscle weakness (generalized): Secondary | ICD-10-CM | POA: Diagnosis not present

## 2021-05-03 DIAGNOSIS — M6281 Muscle weakness (generalized): Secondary | ICD-10-CM | POA: Diagnosis not present

## 2021-05-03 DIAGNOSIS — M25652 Stiffness of left hip, not elsewhere classified: Secondary | ICD-10-CM | POA: Diagnosis not present

## 2021-05-03 DIAGNOSIS — M25552 Pain in left hip: Secondary | ICD-10-CM | POA: Diagnosis not present

## 2021-05-05 DIAGNOSIS — M6281 Muscle weakness (generalized): Secondary | ICD-10-CM | POA: Diagnosis not present

## 2021-05-05 DIAGNOSIS — M25552 Pain in left hip: Secondary | ICD-10-CM | POA: Diagnosis not present

## 2021-05-05 DIAGNOSIS — M25652 Stiffness of left hip, not elsewhere classified: Secondary | ICD-10-CM | POA: Diagnosis not present

## 2021-05-10 DIAGNOSIS — M25652 Stiffness of left hip, not elsewhere classified: Secondary | ICD-10-CM | POA: Diagnosis not present

## 2021-05-10 DIAGNOSIS — M6281 Muscle weakness (generalized): Secondary | ICD-10-CM | POA: Diagnosis not present

## 2021-05-10 DIAGNOSIS — M25552 Pain in left hip: Secondary | ICD-10-CM | POA: Diagnosis not present

## 2021-05-12 DIAGNOSIS — M25552 Pain in left hip: Secondary | ICD-10-CM | POA: Diagnosis not present

## 2021-05-12 DIAGNOSIS — M25652 Stiffness of left hip, not elsewhere classified: Secondary | ICD-10-CM | POA: Diagnosis not present

## 2021-05-12 DIAGNOSIS — M6281 Muscle weakness (generalized): Secondary | ICD-10-CM | POA: Diagnosis not present

## 2021-05-17 DIAGNOSIS — M6281 Muscle weakness (generalized): Secondary | ICD-10-CM | POA: Diagnosis not present

## 2021-05-17 DIAGNOSIS — M25552 Pain in left hip: Secondary | ICD-10-CM | POA: Diagnosis not present

## 2021-05-17 DIAGNOSIS — M25652 Stiffness of left hip, not elsewhere classified: Secondary | ICD-10-CM | POA: Diagnosis not present

## 2021-05-19 DIAGNOSIS — M6281 Muscle weakness (generalized): Secondary | ICD-10-CM | POA: Diagnosis not present

## 2021-05-19 DIAGNOSIS — M25552 Pain in left hip: Secondary | ICD-10-CM | POA: Diagnosis not present

## 2021-05-19 DIAGNOSIS — M25652 Stiffness of left hip, not elsewhere classified: Secondary | ICD-10-CM | POA: Diagnosis not present

## 2021-05-24 DIAGNOSIS — M6281 Muscle weakness (generalized): Secondary | ICD-10-CM | POA: Diagnosis not present

## 2021-05-24 DIAGNOSIS — M25652 Stiffness of left hip, not elsewhere classified: Secondary | ICD-10-CM | POA: Diagnosis not present

## 2021-05-24 DIAGNOSIS — M25552 Pain in left hip: Secondary | ICD-10-CM | POA: Diagnosis not present

## 2021-05-26 DIAGNOSIS — M25552 Pain in left hip: Secondary | ICD-10-CM | POA: Diagnosis not present

## 2021-05-26 DIAGNOSIS — M25652 Stiffness of left hip, not elsewhere classified: Secondary | ICD-10-CM | POA: Diagnosis not present

## 2021-05-26 DIAGNOSIS — M6281 Muscle weakness (generalized): Secondary | ICD-10-CM | POA: Diagnosis not present

## 2021-06-14 DIAGNOSIS — Z08 Encounter for follow-up examination after completed treatment for malignant neoplasm: Secondary | ICD-10-CM | POA: Diagnosis not present

## 2021-06-14 DIAGNOSIS — D485 Neoplasm of uncertain behavior of skin: Secondary | ICD-10-CM | POA: Diagnosis not present

## 2021-06-14 DIAGNOSIS — Z1283 Encounter for screening for malignant neoplasm of skin: Secondary | ICD-10-CM | POA: Diagnosis not present

## 2021-06-14 DIAGNOSIS — D2271 Melanocytic nevi of right lower limb, including hip: Secondary | ICD-10-CM | POA: Diagnosis not present

## 2021-06-14 DIAGNOSIS — L821 Other seborrheic keratosis: Secondary | ICD-10-CM | POA: Diagnosis not present

## 2021-06-14 DIAGNOSIS — Z85820 Personal history of malignant melanoma of skin: Secondary | ICD-10-CM | POA: Diagnosis not present

## 2021-07-18 ENCOUNTER — Ambulatory Visit (INDEPENDENT_AMBULATORY_CARE_PROVIDER_SITE_OTHER): Payer: Medicare Other

## 2021-07-18 ENCOUNTER — Ambulatory Visit: Payer: Medicare Other | Admitting: Podiatry

## 2021-07-18 ENCOUNTER — Encounter: Payer: Self-pay | Admitting: Podiatry

## 2021-07-18 DIAGNOSIS — M778 Other enthesopathies, not elsewhere classified: Secondary | ICD-10-CM | POA: Diagnosis not present

## 2021-07-18 DIAGNOSIS — D3613 Benign neoplasm of peripheral nerves and autonomic nervous system of lower limb, including hip: Secondary | ICD-10-CM | POA: Diagnosis not present

## 2021-07-18 DIAGNOSIS — D3610 Benign neoplasm of peripheral nerves and autonomic nervous system, unspecified: Secondary | ICD-10-CM

## 2021-07-18 NOTE — Progress Notes (Signed)
Subjective:  ? ?Patient ID: Gabriel Pittman, male   DOB: 66 y.o.   MRN: [id number]  ? ?HPI ?Patient presents stating he is developing a lot of shooting pain in his left forefoot and admits its been there for about 8 to 10 months and gradually becoming more of an issue since that he has tried wider shoes and other modalities without relief ? ? ?ROS ? ? ?   ?Objective:  ?Physical Exam  ?Neurovascular status intact with what appears to be neuroma-like symptomatology third interspace left with positive Mulder sign and shooting discomfort.  Patient has good digital perfusion well oriented  ? ?   ?Assessment:  ?Probability for neuroma symptomatology left ? ?   ?Plan:  ?Reviewed condition sterile prep and injected with a neurolysis destructive agent purified alcohol Marcaine 1.5 cc third interspace left did discuss possibility of surgical resection depending on the response given the long history of pain and deformity ? ?X-rays were negative for signs of bone issues or any other pathology from that perspective ?   ? ? ?

## 2021-08-03 ENCOUNTER — Telehealth: Payer: Self-pay

## 2021-08-03 ENCOUNTER — Ambulatory Visit: Payer: Medicare Other | Admitting: Podiatry

## 2021-08-03 ENCOUNTER — Encounter: Payer: Self-pay | Admitting: Podiatry

## 2021-08-03 DIAGNOSIS — D3610 Benign neoplasm of peripheral nerves and autonomic nervous system, unspecified: Secondary | ICD-10-CM | POA: Diagnosis not present

## 2021-08-03 NOTE — Progress Notes (Signed)
Subjective:   Patient ID: Charlcie Cradle, male   DOB: 66 y.o.   MRN: [id number]   HPI Patient presents with chronic irritation of the third interspace left which is remaining tender and only got better for about a week after the last procedure several weeks ago.  States it did do very well with the injection but it came back very quickly   ROS      Objective:  Physical Exam  Neurovascular status found to be intact muscle strength found to be adequate with positive Biagio Borg sign and pain of the third interspace left foot with shooting discomfort     Assessment:  Neuroma symptomatology left with pain     Plan:  Reviewed at great length and failure to respond more than short-term to the injection treatment.  I did discuss excision of neuroma explaining procedure and risk and patient wants the procedure understanding risk.  At this point I allowed him to go over consent form reviewing all possible complications and the fact there is no long-term guarantees this will solve the problem and he is willing to accept this risk wants procedure and after extensive review signed consent form.  Patient scheduled outpatient surgery all questions answered understands total recovery can take several months encouraged to call questions concerns prior to procedure

## 2021-08-03 NOTE — Telephone Encounter (Signed)
DOS 08/09/2021  NEURECTOMY 3RD LT - 28080  [employer] EFFECTIVE DATE - 03/13/2021  PLAN DEDUCTIBLE - $0.00 OUT OF POCKET - $4500.00 X/$8338.25 REMAINING  CO-INSURANCE 0% / Day OUTPATIENT SURGERY 0% / [facility] $325 / Day OUTPATIENT SURGERY $325 / [place]   Notification or Prior Authorization is not required for the requested services  Decision ID #:[id number]

## 2021-08-06 ENCOUNTER — Other Ambulatory Visit: Payer: Self-pay | Admitting: Registered Nurse

## 2021-08-06 DIAGNOSIS — E7849 Other hyperlipidemia: Secondary | ICD-10-CM

## 2021-08-09 DIAGNOSIS — G5762 Lesion of plantar nerve, left lower limb: Secondary | ICD-10-CM | POA: Diagnosis not present

## 2021-08-09 MED ORDER — HYDROCODONE-ACETAMINOPHEN 10-325 MG PO TABS: 1.0000 | ORAL_TABLET | Freq: Three times a day (TID) | ORAL | 0 refills | Status: AC | PRN

## 2021-08-09 NOTE — Addendum Note (Signed)
Addended by: Wallene Huh on: 08/09/2021 07:03 AM   Modules accepted: Orders

## 2021-08-15 ENCOUNTER — Encounter: Payer: Medicare Other | Admitting: Podiatry

## 2021-08-15 ENCOUNTER — Other Ambulatory Visit (HOSPITAL_COMMUNITY): Payer: Self-pay | Admitting: Internal Medicine

## 2021-08-15 DIAGNOSIS — I6523 Occlusion and stenosis of bilateral carotid arteries: Secondary | ICD-10-CM

## 2021-08-19 ENCOUNTER — Ambulatory Visit (INDEPENDENT_AMBULATORY_CARE_PROVIDER_SITE_OTHER): Payer: Medicare Other | Admitting: Podiatry

## 2021-08-19 ENCOUNTER — Encounter: Payer: Self-pay | Admitting: Podiatry

## 2021-08-19 DIAGNOSIS — D3610 Benign neoplasm of peripheral nerves and autonomic nervous system, unspecified: Secondary | ICD-10-CM

## 2021-08-19 NOTE — Progress Notes (Signed)
Subjective:   Patient ID: Gabriel Pittman, male   DOB: 66 y.o.   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

## 2021-08-22 ENCOUNTER — Ambulatory Visit (HOSPITAL_COMMUNITY)
Admission: RE | Admit: 2021-08-22 | Discharge: 2021-08-22 | Disposition: A | Discharge status: Home / Self Care | Payer: Medicare Other | Source: Ambulatory Visit | Attending: Cardiology | Admitting: Cardiology

## 2021-08-22 DIAGNOSIS — I6523 Occlusion and stenosis of bilateral carotid arteries: Secondary | ICD-10-CM | POA: Diagnosis not present

## 2021-08-24 ENCOUNTER — Telehealth: Payer: Self-pay

## 2021-08-24 DIAGNOSIS — I6523 Occlusion and stenosis of bilateral carotid arteries: Secondary | ICD-10-CM

## 2021-08-24 NOTE — Telephone Encounter (Signed)
Pt advised and Carotid ordered for one year.

## 2021-08-24 NOTE — Telephone Encounter (Signed)
-----   Message from [name], MD sent at 08/23/2021 10:52 PM EDT ----- MIld to moderate plaquing of carotid arteries     Would recomm continued follow up with [facility] in 12 months

## 2021-08-29 ENCOUNTER — Encounter: Payer: Self-pay | Admitting: Podiatry

## 2021-11-14 ENCOUNTER — Ambulatory Visit
Admission: EM | Admit: 2021-11-14 | Discharge: 2021-11-14 | Disposition: A | Discharge status: Home / Self Care | Payer: Medicare Other | Attending: Urgent Care | Admitting: Urgent Care

## 2021-11-14 DIAGNOSIS — L299 Pruritus, unspecified: Secondary | ICD-10-CM

## 2021-11-14 DIAGNOSIS — Z8619 Personal history of other infectious and parasitic diseases: Secondary | ICD-10-CM

## 2021-11-14 DIAGNOSIS — R21 Rash and other nonspecific skin eruption: Secondary | ICD-10-CM | POA: Diagnosis not present

## 2021-11-14 DIAGNOSIS — A6000 Herpesviral infection of urogenital system, unspecified: Secondary | ICD-10-CM

## 2021-11-14 MED ORDER — HYDROXYZINE HCL 25 MG PO TABS: 12.5000 mg | ORAL_TABLET | Freq: Three times a day (TID) | ORAL | 0 refills | Status: DC | PRN

## 2021-11-14 MED ORDER — PREDNISONE 20 MG PO TABS: 40.0000 mg | ORAL_TABLET | Freq: Every day | ORAL | 0 refills | Status: DC

## 2021-11-14 MED ORDER — VALACYCLOVIR HCL 1 G PO TABS: ORAL_TABLET | ORAL | 5 refills | Status: DC

## 2021-11-14 NOTE — ED Provider Notes (Signed)
[facility]  Note:  This document was prepared using Dragon voice recognition software and may include unintentional dictation errors.  MRN: [id number] DOB: 13-Mar-1956  Subjective:   Gabriel Pittman is a 66 y.o. male presenting for 1 week history of persistent urticarial lesions over his waist and now in the armpit area and arms.  Has been using betamethasone with very temporary relief.  Reports a history of sensitive skin that easily relax and gets hives.  He has been seen with a dermatologist before but they retired.  Denies eating any new foods, starting new medications, exposure to poisonous plants, new hygiene products, new cleaning products or detergents.  The only thing he can think of is that his wife used Pine-Sol with one of the laundry loads recently.  But they re-washed everything.  He would also like a refill of Valtrex for his genital herpes.  No current facility-administered medications for this encounter.  Current Outpatient Medications:    aspirin EC 81 MG tablet, Take 1 tablet (81 mg total) by mouth daily., Disp: 90 tablet, Rfl: 3   diclofenac (VOLTAREN) 75 MG EC tablet, TAKE 1 TABLET BY MOUTH TWICE A DAY, Disp: 50 tablet, Rfl: 2   ezetimibe (ZETIA) 10 MG tablet, TAKE 1 TABLET BY MOUTH  DAILY, Disp: 90 tablet, Rfl: 3   lisinopril (ZESTRIL) 10 MG tablet, TAKE 1 TABLET BY MOUTH  DAILY, Disp: 90 tablet, Rfl: 3   Loratadine (CLARITIN PO), Take 10 mg by mouth as needed. , Disp: , Rfl:    rosuvastatin (CRESTOR) 20 MG tablet, Take 1 tablet (20 mg total) by mouth daily., Disp: 90 tablet, Rfl: 3   valACYclovir (VALTREX) 1000 MG tablet, Take 1 tablet (1,000 mg total) by mouth 2 (two) times daily as needed., Disp: 20 tablet, Rfl: 3   Allergies  Allergen Reactions   Codeine Nausea Only   Levofloxacin Hives    Past Medical History:  Diagnosis Date   Allergy    Arthritis    Cancer ([place])    Phreesia 12/10/2019   Carotid artery disease ([facility])    Coarctation of aorta     a. s/p remote surgery.   Essential hypertension    Former tobacco use    Hyperlipidemia    Melanoma ([place]) 1990   RIGHT posterior shoulder   Polysubstance abuse ([place])    a. former alcoholic, drug use ("all of them"). Sober for 11 years as of 2019.   Tubular adenoma of colon 12/2014     Past Surgical History:  Procedure Laterality Date   AORTA SURGERY     congenital narrowing repair   MELANOMA EXCISION Right 1990   RIGHT posterior shoulder    Family History  Problem Relation Age of Onset   Heart disease Mother    Diabetes Mother    Hyperlipidemia Mother    Heart disease Father    Diabetes Father    Hypertension Father    Cancer Sister        per patient of the bone   Hyperlipidemia Sister    Diabetes Sister    Heart disease Brother    Colon cancer Neg Hx     Social History   Tobacco Use   Smoking status: Former    Packs/day: 0.50    Years: 20.00    Total pack years: 10.00    Types: Cigarettes    Quit date: 03/14/2015    Years since quitting: 6.6   Smokeless tobacco: Former  Scientific laboratory technician  Use: Every day  Substance Use Topics   Alcohol use: No    Alcohol/week: 0.0 standard drinks of alcohol   Drug use: No    ROS   Objective:   Vitals: BP (!) 165/78 (BP Location: Left Arm)   Pulse 60   Temp 98.1 F (36.7 C) (Oral)   Resp 18   SpO2 97%   Physical Exam Constitutional:      General: He is not in acute distress.    Appearance: Normal appearance. He is well-developed and normal weight. He is not ill-appearing, toxic-appearing or diaphoretic.  HENT:     Head: Normocephalic and atraumatic.     Right Ear: External ear normal.     Left Ear: External ear normal.     Nose: Nose normal.     Mouth/Throat:     Pharynx: Oropharynx is clear.  Eyes:     General: No scleral icterus.       Right eye: No discharge.        Left eye: No discharge.     Extraocular Movements: Extraocular movements intact.  Cardiovascular:     Rate and Rhythm: Normal rate.   Pulmonary:     Effort: Pulmonary effort is normal.  Musculoskeletal:     Cervical back: Normal range of motion.  Skin:    Findings: Rash (scattered urticarial lesions and patches over the waist area and axillary region extending into the upper proximal extremities) present.  Neurological:     Mental Status: He is alert and oriented to person, place, and time.  Psychiatric:        Mood and Affect: Mood normal.        Behavior: Behavior normal.        Thought Content: Thought content normal.        Judgment: Judgment normal.     Assessment and Plan :   PDMP not reviewed this encounter.  1. Rash and nonspecific skin eruption   2. Itching   3. 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 Genital herpes simplex, unspecified site    Will cover for an allergic dermatitis, allergic urticaria with prednisone and hydroxyzine.  Emphasized need to follow-up with his PCP or new dermatologist.  Refilled his Valtrex. Counseled patient on potential for adverse effects with medications prescribed/recommended today, ER and return-to-clinic precautions discussed, patient verbalized understanding.    Jaynee Eagles, PA-C 11/14/21 1436

## 2021-11-14 NOTE — ED Triage Notes (Signed)
Pt c/o rash around waist and under right arm. Onset ~ 1 week ago. Has cream for betamethasone at home he has tried without relief.   Also requesting refill valtrex

## 2021-12-10 ENCOUNTER — Other Ambulatory Visit: Payer: Self-pay | Admitting: Internal Medicine

## 2021-12-10 DIAGNOSIS — E7849 Other hyperlipidemia: Secondary | ICD-10-CM

## 2021-12-24 ENCOUNTER — Other Ambulatory Visit: Payer: Self-pay | Admitting: Internal Medicine

## 2021-12-24 DIAGNOSIS — I10 Essential (primary) hypertension: Secondary | ICD-10-CM

## 2021-12-29 ENCOUNTER — Other Ambulatory Visit: Payer: Self-pay | Admitting: Internal Medicine

## 2021-12-29 DIAGNOSIS — E7849 Other hyperlipidemia: Secondary | ICD-10-CM

## 2022-01-03 ENCOUNTER — Other Ambulatory Visit: Payer: Self-pay | Admitting: Internal Medicine

## 2022-01-03 DIAGNOSIS — E7849 Other hyperlipidemia: Secondary | ICD-10-CM

## 2022-02-19 NOTE — Progress Notes (Deleted)
Cardiology Office Note   Date:  02/19/2022   ID:  Gabriel Pittman, DOB 11-Oct-1955, MRN [id number]  PCP:  Pcp, No  Cardiologist:   Dorris Carnes, MD    F/U of HTN and  Aortic pathology     History of Present Illness: Gabriel Pittman is a 66 y.o. 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  Notes occasional dizziness at night ' I saw the pt in Aug 2022  No outpatient medications have been marked as taking for the 02/21/22 encounter (Appointment) with [name], MD.     Allergies:   Codeine and Levofloxacin   Past Medical History:  Diagnosis Date   Allergy    Arthritis    Cancer [facility])    Phreesia 12/10/2019   Carotid artery disease ([place])    Coarctation of aorta    a. s/p remote surgery.   Essential hypertension    Former tobacco use    Hyperlipidemia    Melanoma ([place]) 1990   RIGHT posterior shoulder   Polysubstance abuse ([place])    a. former alcoholic, drug use ("all of them"). Sober for 11 years as of 2019.   Tubular adenoma of colon 12/2014    Past Surgical History:  Procedure Laterality Date   AORTA SURGERY     congenital narrowing repair   MELANOMA EXCISION Right 1990   RIGHT posterior shoulder     Social History:  The patient  reports that he quit smoking about 6 years ago. His smoking use included cigarettes. He has a 10.00 pack-year smoking history. He has quit using smokeless tobacco. He reports that he does not drink alcohol and does not use drugs.   Family History:  The patient's family history includes Cancer in his sister; Diabetes in his father, mother, and sister; Heart disease in his brother, father, and mother; Hyperlipidemia in his mother and sister; Hypertension in his father.    ROS:  Please see the history of present illness. All other systems are reviewed and  Negative to  the above problem except as noted.    PHYSICAL EXAM: VS:  There were no vitals taken for this visit.  GEN: Well nourished, well developed, in no acute distress  HEENT: normal  Neck: no JVD or carotid bruits Cardiac: RRR; no murmurs  No LE edema  Respiratory:  clear to auscultation bilaterally,  GI: soft, nontender, nondistended, + BS  No hepatomegaly  MS: no deformity Moving all extremities   Skin: warm and dry, no rash Neuro:  Strength and sensation are intact Psych: euthymic mood, full affect   EKG:  EKG is  ordered today.   SB  54 bpm     Carotid USN   Summary:  Right Carotid: Velocities in the right ICA are consistent with a 40-59%                 stenosis. The ECA appears >50% stenosed.   Left Carotid: Velocities in the left ICA are consistent with a 1-39%  stenosis.                The ECA appears >50% stenosed.   Vertebrals:  Bilateral vertebral arteries demonstrate antegrade flow.  Subclavians: Right subclavian artery was stenotic. Right subclavian artery  flow  was disturbed. Normal flow hemodynamics were seen in the left               subclavian artery.   *See table(s) above for measurements and observations.  Suggest follow up study in 12 months.  Lipid Panel    Component Value Date/Time   CHOL 113 12/12/2019 1013   TRIG 82 12/12/2019 1013   HDL 46 12/12/2019 1013   CHOLHDL 2.5 12/12/2019 1013   CHOLHDL 2.7 12/21/2015 0810   VLDL 11 12/21/2015 0810   LDLCALC 51 12/12/2019 1013      Wt Readings from Last 3 Encounters:  11/01/20 154 lb 9.6 oz (70.1 kg)  12/12/19 160 lb 3.2 oz (72.7 kg)  09/08/19 162 lb 12.8 oz (73.8 kg)      ASSESSMENT AND PLAN:  1  Aortic pathology   Pt is s/p repair of coarctation of the aorta     Will plan of follow up scan next winter   2  CV dz  CV study above   Will continue to follow up in 1 year   keep on statin     3  HTN  BP is controlled   Continue on lisinopril    4  HL   Continue crestor    Follow  lipids  Will check BMET and CBC as well    F/U in 1 year   Current medicines are reviewed at length with the patient today.  The patient does not have concerns regarding medicines.  Signed, Dorris Carnes, MD  02/19/2022 10:05 PM    [address], [place], [place]  [address] Phone: [phone]; Fax: [phone]

## 2022-02-21 ENCOUNTER — Ambulatory Visit: Payer: Medicare Other | Admitting: Internal Medicine

## 2022-02-21 NOTE — Progress Notes (Unsigned)
Cardiology Office Note:    Date:  02/22/2022   ID:  Gabriel Pittman, DOB 1955-05-20, MRN [id number]  PCP:  Kathyrn Lass  [place] [facility] Cardiologist:  Dorris Carnes, MD     Referring MD: No ref. provider found   Chief Complaint:  No chief complaint on file.     History of Present Illness:   Gabriel Pittman is a 66 y.o. male with history of repair of coarctation of the aorta, HTN, MVP, carotid disease, history of polysubstance abuse-no longer.  Patient last saw Dr. Harrington Challenger 10/2020 with plans of f/u echo.  Patient comes in for f/u. Says his heart is doing fine but he's having a lot of trouble with his feet and had neuroma removed.  No chest pain, dyspnea, dizziness or edema. Goes to the gym 3 days a week and hikes a lot. Doesn't smoke and has an occasional beer.      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 s/p remote surgery.   Essential hypertension    Former tobacco use    Hyperlipidemia    Melanoma ([place]) 1990   RIGHT posterior shoulder   Polysubstance abuse ([place])    a. former alcoholic, drug use ("all of them"). Sober for 11 years as of 2019.   Tubular adenoma of colon 12/2014   Current Medications: Current Meds  Medication Sig   aspirin EC 81 MG tablet Take 1 tablet (81 mg total) by mouth daily.   ezetimibe (ZETIA) 10 MG tablet TAKE 1 TABLET BY MOUTH  DAILY   lisinopril (ZESTRIL) 10 MG tablet TAKE 1 TABLET BY MOUTH  DAILY   Loratadine (CLARITIN PO) Take 10 mg by mouth as needed.    rosuvastatin (CRESTOR) 20 MG tablet TAKE 1 TABLET BY MOUTH DAILY   valACYclovir (VALTREX) 1000 MG tablet At the start of an outbreak take 1 tablet daily for 5 days.    Allergies:   Codeine and Levofloxacin   Social History   Tobacco Use   Smoking status: Former    Packs/day: 0.50    Years: 20.00    Total pack years: 10.00    Types: Cigarettes    Quit  date: 03/14/2015    Years since quitting: 6.9   Smokeless tobacco: Former  Scientific laboratory technician Use: Every day  Substance Use Topics   Alcohol use: No    Alcohol/week: 0.0 standard drinks of alcohol   Drug use: No    Family Hx: The patient's family history includes Cancer in his sister; Diabetes in his father, mother, and sister; Heart disease in his brother, father, and mother; Hyperlipidemia in his mother and sister; Hypertension in his father. There is no history of Colon cancer.  ROS     Physical Exam:    VS:  BP 116/62   Pulse 61   Ht '5\' 9"'$  (1.753 m)   Wt 158 lb 6.4 oz (71.8 kg)   SpO2 96%   BMI 23.39 kg/m     Wt Readings from Last 3 Encounters:  02/22/22 158 lb 6.4 oz (71.8 kg)  11/01/20 154 lb 9.6 oz (70.1 kg)  12/12/19 160 lb 3.2 oz (72.7 kg)    Physical Exam  GEN: Thin, in no acute distress  Neck: bilateral carotid bruits R>L no JVD, or masses Cardiac:RRR; 1/6  systolic murmur LSB Respiratory:  clear to auscultation bilaterally, normal work of breathing GI: soft, nontender, nondistended, + BS Ext: without cyanosis, clubbing, or edema, Good distal pulses bilaterally Neuro:  Alert and Oriented x 3,   Psych: euthymic mood, full affect        EKGs/Labs/Other Test Reviewed:    EKG:  EKG is   ordered today.  The ekg ordered today demonstrates NSR, normal EKG  Recent Labs: No results found for requested labs within last 365 days.   Recent Lipid Panel No results for input(s): "CHOL", "TRIG", "HDL", "VLDL", "LDLCALC", "LDLDIRECT" in the last 8760 hours.   Prior CV Studies:   Carotid dopplers 08/2021 Summary:  Right Carotid: Velocities in the right ICA are consistent with a 40-59%                 stenosis. The ECA appears >50% stenosed.   Left Carotid: Velocities in the left ICA are consistent with a 1-39%  stenosis.                The ECA appears >50% stenosed.   Vertebrals:  Bilateral vertebral arteries demonstrate antegrade flow.  Subclavians: Right  subclavian artery was stenotic. Right subclavian artery  flow               was disturbed. Normal flow hemodynamics were seen in the left               subclavian artery.   *See table(s) above for measurements and observations.  Suggest follow up study in 12 months.  Lipid Panel    Echo 2019 Study Conclusions   - Left ventricle: The cavity size was normal. Systolic function was    normal. The estimated ejection fraction was in the range of 55%    to 60%. Wall motion was normal; there were no regional wall    motion abnormalities. Left ventricular diastolic function    parameters were normal.  - Aortic valve: Trileaflet; normal thickness, mildly calcified    leaflets. There was mild regurgitation. Regurgitation pressure    half-time: 871 ms.  - Aorta: S/P coarctation repair with peak gradient across repair of    14mHg. Aortic root dimension: 41 mm (ED). Ascending aorta    diameter: 40 mm (ED).  - Aortic root: The aortic root was mildly dilated.  - Ascending aorta: The ascending aorta was mildly dilated.  - Mitral valve: Elongation of the anterior leaflet. Prolapse.  - Pulmonic valve: There was trivial regurgitation.   Recommendations:  consider cardiac/chest MRI/MRA to assess  coarctation further.   -------------------------------------------------------------------  Study data:  No prior study was available for comparison.  Study  status:  Routine.  Procedure:  The patient reported no pain pre or  post test. Transthoracic echocardiography. Image quality was  adequate.  Study completion:  There were no complications.  Transthoracic echocardiography.  M-mode, complete 2D, spectral  Doppler, and color Doppler.  Birthdate:  Patient birthdate:  [birth year]  Age:  Patient is 66yr old.  Sex:  Gender: male.  BMI: 23.6 kg/m^2.  Blood pressure:     144/70  Patient status:  Outpatient.  Study date:  Study date: 07/23/2017. Study time: 09:13  AM.  [facility]    -------------------------------------------------------------------     Risk Assessment/Calculations/Metrics:              ASSESSMENT & PLAN:   No problem-specific Assessment & Plan notes found for this encounter.   HTN BP well  controlled on lisinopril  MVP and History of coarctation of the aorta-due for f/u echo. asymptomatic  Carotid disease 40-59% right 1-39% left on dopplers 08/2021-yearly f/u on crestor and zetia. LDL 58 10/2020-recheck all labs today.            Dispo:  No follow-ups on file.   Medication Adjustments/Labs and Tests Ordered: Current medicines are reviewed at length with the patient today.  Concerns regarding medicines are outlined above.  Tests Ordered: Orders Placed This Encounter  Procedures   TSH   Comprehensive metabolic panel   CBC   Lipid panel   EKG 12-Lead   ECHOCARDIOGRAM COMPLETE   Medication Changes: No orders of the defined types were placed in this encounter.  Signed, Ermalinda Barrios, PA-C  02/22/2022 9:17 AM    [facility] [place], [place],   [address] Phone: [phone]; Fax: [phone]

## 2022-02-22 ENCOUNTER — Ambulatory Visit: Payer: Medicare Other | Attending: Internal Medicine | Admitting: Physician Assistant

## 2022-02-22 ENCOUNTER — Encounter: Payer: Self-pay | Admitting: Physician Assistant

## 2022-02-22 VITALS — BP 116/62 | HR 61 | Ht 69.0 in | Wt 158.4 lb

## 2022-02-22 DIAGNOSIS — I341 Nonrheumatic mitral (valve) prolapse: Secondary | ICD-10-CM | POA: Diagnosis not present

## 2022-02-22 DIAGNOSIS — Q251 Coarctation of aorta: Secondary | ICD-10-CM

## 2022-02-22 DIAGNOSIS — I10 Essential (primary) hypertension: Secondary | ICD-10-CM | POA: Diagnosis not present

## 2022-02-22 DIAGNOSIS — I6523 Occlusion and stenosis of bilateral carotid arteries: Secondary | ICD-10-CM

## 2022-02-22 NOTE — Patient Instructions (Signed)
Medication Instructions:  Your physician recommends that you continue on your current medications as directed. Please refer to the Current Medication list given to you today.  *If you need a refill on your cardiac medications before your next appointment, please call your pharmacy*   Lab Work: CMET, CBC, LIPID, TSH- TODAY   If you have labs (blood work) drawn today and your tests are completely normal, you will receive your results only by: [facility] (if you have MyChart) OR A paper copy in the mail If you have any lab test that is abnormal or we need to change your treatment, we will call you to review the results.   Testing/Procedures: Your physician has requested that you have an echocardiogram. Echocardiography is a painless test that uses sound waves to create images of your heart. It provides your doctor with information about the size and shape of your heart and how well your heart's chambers and valves are working. This procedure takes approximately one hour. There are no restrictions for this procedure. Please do NOT wear cologne, perfume, aftershave, or lotions (deodorant is allowed). Please arrive 15 minutes prior to your appointment time.    Follow-Up: At [facility], you and your health needs are our priority.  As part of our continuing mission to provide you with exceptional heart care, we have created designated Provider Care Teams.  These Care Teams include your primary Cardiologist (physician) and Advanced Practice Providers (APPs -  Physician Assistants and Nurse Practitioners) who all work together to provide you with the care you need, when you need it.  We recommend signing up for the patient portal called "MyChart".  Sign up information is provided on this After Visit Summary.  MyChart is used to connect with patients for Virtual Visits (Telemedicine).  Patients are able to view lab/test results, encounter notes, upcoming appointments, etc.  Non-urgent  messages can be sent to your provider as well.   To learn more about what you can do with MyChart, go to [email or web address].    Your next appointment:   12 month(s)  The format for your next appointment:   In Person  Provider:   Dorris Carnes, MD     Other Instructions   Important Information About Sugar

## 2022-02-23 ENCOUNTER — Other Ambulatory Visit: Payer: Self-pay | Admitting: Internal Medicine

## 2022-02-23 DIAGNOSIS — I10 Essential (primary) hypertension: Secondary | ICD-10-CM

## 2022-02-23 LAB — COMPREHENSIVE METABOLIC PANEL
ALT: 24 IU/L (ref 0–44)
AST: 23 IU/L (ref 0–40)
Albumin/Globulin Ratio: 2.1 (ref 1.2–2.2)
Albumin: 4.4 g/dL (ref 3.9–4.9)
Alkaline Phosphatase: 53 IU/L (ref 44–121)
BUN/Creatinine Ratio: 18 (ref 10–24)
BUN: 16 mg/dL (ref 8–27)
Bilirubin Total: 0.4 mg/dL (ref 0.0–1.2)
CO2: 25 mmol/L (ref 20–29)
Calcium: 9.2 mg/dL (ref 8.6–10.2)
Chloride: 103 mmol/L (ref 96–106)
Creatinine, Ser: 0.9 mg/dL (ref 0.76–1.27)
Globulin, Total: 2.1 g/dL (ref 1.5–4.5)
Glucose: 97 mg/dL (ref 70–99)
Potassium: 4.5 mmol/L (ref 3.5–5.2)
Sodium: 140 mmol/L (ref 134–144)
Total Protein: 6.5 g/dL (ref 6.0–8.5)
eGFR: 93 mL/min/{1.73_m2} (ref >59)

## 2022-02-23 LAB — LIPID PANEL
Chol/HDL Ratio: 2.3 ratio (ref 0.0–5.0)
Cholesterol, Total: 140 mg/dL (ref 100–199)
HDL: 60 mg/dL (ref >39)
LDL Chol Calc (NIH): 69 mg/dL (ref 0–99)
Triglycerides: 51 mg/dL (ref 0–149)
VLDL Cholesterol Cal: 11 mg/dL (ref 5–40)

## 2022-02-23 LAB — CBC
Hematocrit: 38.3 % (ref 37.5–51.0)
Hemoglobin: 13.2 g/dL (ref 13.0–17.7)
MCH: 32.5 pg (ref 26.6–33.0)
MCHC: 34.5 g/dL (ref 31.5–35.7)
MCV: 94 fL (ref 79–97)
Platelets: 275 10*3/uL (ref 150–450)
RBC: 4.06 x10E6/uL — ABNORMAL LOW (ref 4.14–5.80)
RDW: 12.4 % (ref 11.6–15.4)
WBC: 6.8 10*3/uL (ref 3.4–10.8)

## 2022-02-23 LAB — TSH: TSH: 1.88 u[IU]/mL (ref 0.450–4.500)

## 2022-03-01 ENCOUNTER — Other Ambulatory Visit: Payer: Self-pay | Admitting: Internal Medicine

## 2022-03-01 DIAGNOSIS — E7849 Other hyperlipidemia: Secondary | ICD-10-CM

## 2022-03-28 ENCOUNTER — Ambulatory Visit (HOSPITAL_COMMUNITY): Payer: Medicare Other | Attending: Physician Assistant

## 2022-03-28 DIAGNOSIS — Q251 Coarctation of aorta: Secondary | ICD-10-CM | POA: Diagnosis not present

## 2022-03-28 LAB — ECHOCARDIOGRAM COMPLETE
Area-P 1/2: 4.19 cm2
P 1/2 time: 350 msec
S' Lateral: 3.2 cm

## 2022-03-29 ENCOUNTER — Telehealth: Payer: Self-pay

## 2022-03-29 DIAGNOSIS — I77810 Thoracic aortic ectasia: Secondary | ICD-10-CM

## 2022-03-29 DIAGNOSIS — Q251 Coarctation of aorta: Secondary | ICD-10-CM

## 2022-03-29 NOTE — Telephone Encounter (Signed)
-----  Message from Emmaline Life, NP sent at 03/28/2022  5:20 PM EST ----- Bernerd Pho, please order MR Angio Chest w w/o contrast.

## 2022-04-17 ENCOUNTER — Ambulatory Visit (HOSPITAL_COMMUNITY)
Admission: RE | Admit: 2022-04-17 | Discharge: 2022-04-17 | Disposition: A | Discharge status: Home / Self Care | Payer: Medicare Other | Source: Ambulatory Visit | Attending: Physician Assistant | Admitting: Physician Assistant

## 2022-04-17 DIAGNOSIS — I7121 Aneurysm of the ascending aorta, without rupture: Secondary | ICD-10-CM | POA: Insufficient documentation

## 2022-04-17 DIAGNOSIS — Q251 Coarctation of aorta: Secondary | ICD-10-CM | POA: Diagnosis not present

## 2022-04-17 DIAGNOSIS — I700 Atherosclerosis of aorta: Secondary | ICD-10-CM | POA: Insufficient documentation

## 2022-04-17 MED ORDER — GADOBUTROL 1 MMOL/ML IV SOLN
7.0000 mL | Freq: Once | INTRAVENOUS | Status: AC | PRN
  Administered 2022-04-17: 7 mL via INTRAVENOUS

## 2022-04-18 ENCOUNTER — Telehealth: Payer: Self-pay

## 2022-04-18 DIAGNOSIS — I77810 Thoracic aortic ectasia: Secondary | ICD-10-CM

## 2022-04-18 DIAGNOSIS — Q251 Coarctation of aorta: Secondary | ICD-10-CM

## 2022-04-18 NOTE — Telephone Encounter (Signed)
-----   Message from Loel Dubonnet, NP sent at 04/18/2022  9:54 AM EST ----- Covering for Ermalinda Barrios, PA.   MRA reveals stable repair of aortic coarctation. There is mild dilation of the thoracic aorta 4.2 cm and aortic root 4.7 cm - this is stable from previous. We prevent it from worsening by continuing to keep blood pressure well controlled.   Would recommend repeat MRA in one year for monitoring.

## 2022-08-25 ENCOUNTER — Ambulatory Visit (HOSPITAL_COMMUNITY)
Admission: RE | Admit: 2022-08-25 | Discharge: 2022-08-25 | Disposition: A | Discharge status: Home / Self Care | Payer: Medicare Other | Source: Ambulatory Visit | Attending: Internal Medicine | Admitting: Internal Medicine

## 2022-08-25 DIAGNOSIS — I6523 Occlusion and stenosis of bilateral carotid arteries: Secondary | ICD-10-CM | POA: Diagnosis present

## 2022-10-27 ENCOUNTER — Encounter: Payer: Self-pay | Admitting: Family Medicine

## 2022-10-27 ENCOUNTER — Ambulatory Visit (INDEPENDENT_AMBULATORY_CARE_PROVIDER_SITE_OTHER): Payer: Medicare Other | Admitting: Family Medicine

## 2022-10-27 VITALS — BP 134/82 | HR 79 | Temp 97.8°F | Ht 70.0 in | Wt 159.6 lb

## 2022-10-27 DIAGNOSIS — K635 Polyp of colon: Secondary | ICD-10-CM | POA: Diagnosis not present

## 2022-10-27 DIAGNOSIS — I10 Essential (primary) hypertension: Secondary | ICD-10-CM

## 2022-10-27 DIAGNOSIS — A6000 Herpesviral infection of urogenital system, unspecified: Secondary | ICD-10-CM | POA: Diagnosis not present

## 2022-10-27 DIAGNOSIS — L309 Dermatitis, unspecified: Secondary | ICD-10-CM | POA: Insufficient documentation

## 2022-10-27 DIAGNOSIS — L308 Other specified dermatitis: Secondary | ICD-10-CM

## 2022-10-27 DIAGNOSIS — I77810 Thoracic aortic ectasia: Secondary | ICD-10-CM | POA: Diagnosis not present

## 2022-10-27 DIAGNOSIS — Z125 Encounter for screening for malignant neoplasm of prostate: Secondary | ICD-10-CM

## 2022-10-27 HISTORY — DX: Encounter for screening for malignant neoplasm of prostate: Z12.5

## 2022-10-27 LAB — MICROALBUMIN / CREATININE URINE RATIO
Creatinine,U: 140.9 mg/dL
Microalb Creat Ratio: 0.6 mg/g (ref 0.0–30.0)
Microalb, Ur: 0.8 mg/dL (ref 0.0–1.9)

## 2022-10-27 LAB — PSA: PSA: 0.62 ng/mL (ref 0.10–4.00)

## 2022-10-27 MED ORDER — BETAMETHASONE DIPROPIONATE AUG 0.05 % EX CREA: TOPICAL_CREAM | Freq: Two times a day (BID) | CUTANEOUS | 1 refills | Status: DC

## 2022-10-27 NOTE — Assessment & Plan Note (Signed)
Recurrent eczema, particularly worse during summer, currently managed with triamcinolone 0.1% cream which is partially effective. Previous successful treatment with oral prednisone.  Plan: Prescribe betamethasone ointment, apply twice daily. Advise continued use of triamcinolone post-shower for symptomatic relief. Follow-up with patient's dermatologist Educate patient on avoiding triggers, including heat and sweat.

## 2022-10-27 NOTE — Assessment & Plan Note (Signed)
Ongoing monitoring of these cardiac conditions with regular follow-ups. Plan: Continue current medications: Lisinopril, Rosuvastatin, Ezetimibe. Ensure follow-up with cardiology in December for routine check.

## 2022-10-27 NOTE — Progress Notes (Signed)
Assessment/Plan:   Problem List Items Addressed This Visit       Cardiovascular and Mediastinum   Essential (primary) hypertension   Relevant Orders   Microalbumin / creatinine urine ratio   Urinalysis w microscopic + reflex cultur   Aortic root dilation (HCC)    Ongoing monitoring of these cardiac conditions with regular follow-ups. Plan: Continue current medications: Lisinopril, Rosuvastatin, Ezetimibe. Ensure follow-up with cardiology in December for routine check.        Digestive   Polyp of colon   Relevant Orders   Ambulatory referral to Gastroenterology     Musculoskeletal and Integument   Eczema    Recurrent eczema, particularly worse during summer, currently managed with triamcinolone 0.1% cream which is partially effective. Previous successful treatment with oral prednisone.  Plan: Prescribe betamethasone ointment, apply twice daily. Advise continued use of triamcinolone post-shower for symptomatic relief. Follow-up with patient's dermatologist Educate patient on avoiding triggers, including heat and sweat.      Relevant Medications   augmented betamethasone dipropionate (DIPROLENE-AF) 0.05 % cream     Genitourinary   Genital herpes simplex - Primary     Other   Screening for malignant neoplasm of prostate   Relevant Orders   PSA    There are no discontinued medications.  No follow-ups on file.    Subjective:   Encounter date: 10/27/2022  Gabriel Pittman is a 67 y.o. male who has S/P surgery for complex congenital heart disease; History of melanoma excision; Bilateral carotid artery stenosis; Coarctation of aorta; Essential (primary) hypertension; Hyperlipidemia LDL goal <70; Aortic root dilation (HCC); Genital herpes simplex; Screening for malignant neoplasm of prostate; Eczema; and Polyp of colon on their problem list..   He  has a past medical history of Allergy, Anxiety, Arthritis, Cancer (HCC), Carotid artery disease (HCC), Coarctation of  aorta, Depression, Essential hypertension, Former tobacco use, Heart murmur, Hyperlipidemia, Melanoma (HCC) (1990), Polysubstance abuse (HCC), Screening for malignant neoplasm of prostate (10/27/2022), and Tubular adenoma of colon (12/2014)..   Chief Complaint: Patient presents for establishment of care, complaints of a rash on the upper chest, and discussion regarding colonoscopy for colon cancer screening.  History of Present Illness: Rash. Patient reports a recurring rash on the upper chest, typically flaring up every summer. He describes it as itchy and exacerbated by heat and sweat. The rash has been treated with triamcinolone 0.1% cream, which provides transient relief but does not fully resolve the symptoms. He had a significant flare last year around the same time and was treated successfully with oral prednisone.  Colonoscopy Screening. Patient had two small colon polyps removed seven years ago during his last colonoscopy. He is aware of the need for periodic follow-ups and seeks to discuss scheduling another screening.  Review of Systems  Constitutional:  Negative for chills, diaphoresis, fever, malaise/fatigue and weight loss.  HENT:  Negative for congestion, ear discharge, ear pain and hearing loss.   Eyes:  Negative for blurred vision, double vision, photophobia, pain, discharge and redness.  Respiratory:  Negative for cough, sputum production, shortness of breath and wheezing.   Cardiovascular:  Negative for chest pain and palpitations.  Gastrointestinal:  Negative for abdominal pain, blood in stool, constipation, diarrhea, heartburn, melena, nausea and vomiting.  Genitourinary:  Negative for dysuria, flank pain, frequency, hematuria and urgency.  Musculoskeletal:  Negative for myalgias.  Skin:  Positive for rash. Negative for itching.  Neurological:  Negative for dizziness, tingling, tremors, speech change, seizures, loss of consciousness, weakness and headaches.  Psychiatric/Behavioral:  Negative for depression, hallucinations, memory loss, substance abuse and suicidal ideas. The patient does not have insomnia.   All other systems reviewed and are negative.   Past Surgical History:  Procedure Laterality Date   AORTA SURGERY     congenital narrowing repair   MELANOMA EXCISION Right 03/13/1988   RIGHT posterior shoulder    Outpatient Medications Prior to Visit  Medication Sig Dispense Refill   aspirin EC 81 MG tablet Take 1 tablet (81 mg total) by mouth daily. 90 tablet 3   ezetimibe (ZETIA) 10 MG tablet TAKE 1 TABLET BY MOUTH  DAILY 90 tablet 3   lisinopril (ZESTRIL) 10 MG tablet TAKE 1 TABLET BY MOUTH DAILY 90 tablet 3   Loratadine (CLARITIN PO) Take 10 mg by mouth as needed.      rosuvastatin (CRESTOR) 20 MG tablet TAKE 1 TABLET BY MOUTH DAILY 90 tablet 3   valACYclovir (VALTREX) 1000 MG tablet At the start of an outbreak take 1 tablet daily for 5 days. 30 tablet 5   No facility-administered medications prior to visit.    Family History  Problem Relation Age of Onset   Heart disease Mother    Diabetes Mother    Hyperlipidemia Mother    Heart disease Father    Diabetes Father    Hypertension Father    Early death Father    Cancer Sister        per patient of the bone   Hyperlipidemia Sister    Diabetes Sister    Heart disease Brother    Colon cancer Neg Hx     Social History   Socioeconomic History   Marital status: Married    Spouse name: Gabriel Pittman   Number of children: 0   Years of education: Not on file   Highest education level: Some college, no degree  Occupational History   Occupation: retired  Tobacco Use   Smoking status: Former    Current packs/day: 0.00    Average packs/day: 0.5 packs/day for 20.0 years (10.0 ttl pk-yrs)    Types: Cigarettes    Start date: 03/14/1995    Quit date: 03/14/2015    Years since quitting: 7.6   Smokeless tobacco: Former  Building services engineer status: Every Day  Substance and Sexual  Activity   Alcohol use: No   Drug use: No   Sexual activity: Yes    Birth control/protection: None  Other Topics Concern   Not on file  Social History Narrative   Lives with his wife.  She has 2 children from a previous relationship.   Exercise at gym cardio and strength training 3-4 times/week   Social Determinants of Health   Financial Resource Strain: Low Risk  (10/23/2022)   Overall Financial Resource Strain (CARDIA)    Difficulty of Paying Living Expenses: Not very hard  Food Insecurity: No Food Insecurity (10/23/2022)   Hunger Vital Sign    Worried About Running Out of Food in the Last Year: Never true    Ran Out of Food in the Last Year: Never true  Transportation Needs: No Transportation Needs (10/23/2022)   PRAPARE - Administrator, Civil Service (Medical): No    Lack of Transportation (Non-Medical): No  Physical Activity: Sufficiently Active (10/23/2022)   Exercise Vital Sign    Days of Exercise per Week: 3 days    Minutes of Exercise per Session: 60 min  Stress: Stress Concern Present (10/23/2022)   [employer] of Occupational Health -  Occupational Stress Questionnaire    Feeling of Stress : To some extent  Social Connections: Unknown (10/23/2022)   Social Connection and Isolation Panel [NHANES]    Frequency of Communication with Friends and Family: Patient declined    Frequency of Social Gatherings with Friends and Family: Twice a week    Attends Religious Services: Never    Database administrator or Organizations: Yes    Attends Engineer, structural: More than 4 times per year    Marital Status: Married  Catering manager Violence: Not on file                                                                                                  Objective:  Physical Exam: BP 134/82 (BP Location: Left Arm, Patient Position: Sitting, Cuff Size: Large)   Pulse 79   Temp 97.8 F (36.6 C) (Temporal)   Ht 5\' 10"  (1.778 m)   Wt 159 lb 9.6 oz (72.4  kg)   SpO2 97%   BMI 22.90 kg/m     Physical Exam Constitutional:      Appearance: Normal appearance.  HENT:     Head: Normocephalic and atraumatic.     Right Ear: Hearing normal.     Left Ear: Hearing normal.     Nose: Nose normal.  Eyes:     General: No scleral icterus.       Right eye: No discharge.        Left eye: No discharge.     Extraocular Movements: Extraocular movements intact.  Cardiovascular:     Rate and Rhythm: Normal rate and regular rhythm.     Heart sounds: Normal heart sounds.  Pulmonary:     Effort: Pulmonary effort is normal.     Breath sounds: Normal breath sounds.  Abdominal:     Palpations: Abdomen is soft.     Tenderness: There is no abdominal tenderness.  Skin:    General: Skin is warm.     Findings: Erythema and rash present. Rash is papular and scaling.     Comments: chest  Neurological:     General: No focal deficit present.     Mental Status: He is alert.     Cranial Nerves: No cranial nerve deficit.  Psychiatric:        Mood and Affect: Mood normal.        Behavior: Behavior normal.        Thought Content: Thought content normal.        Judgment: Judgment normal.     VAS US CAROTID  Result Date: 08/25/2022 Carotid Arterial Duplex Study Patient Name:  Gabriel Pittman  Date of Exam:   08/25/2022 Medical Rec #: [id number]       Accession #:    [id number] Date of Birth: [birth year]       Patient Gender: M Patient Age:   36 years Exam Location:  [facility] Procedure:      VAS US CAROTID Referring Phys: PAULA ROSS --------------------------------------------------------------------------------  Indications:  Carotid artery disease and patient denies any cerebrovascular  symptoms. Risk Factors: Hypertension, hyperlipidemia, past history of smoking. Performing Technologist: Jeryl Columbia RDCS  Examination Guidelines: A complete evaluation includes B-mode imaging, spectral Doppler, color Doppler, and power Doppler as needed of all  accessible portions of each vessel. Bilateral testing is considered an integral part of a complete examination. Limited examinations for reoccurring indications may be performed as noted.  Right Carotid Findings: +----------+--------+--------+--------+------------------+--------+           PSV cm/sEDV cm/sStenosisPlaque DescriptionComments +----------+--------+--------+--------+------------------+--------+ CCA Prox  86      15                                         +----------+--------+--------+--------+------------------+--------+ CCA Mid   60      9                                          +----------+--------+--------+--------+------------------+--------+ CCA Distal51      11                                         +----------+--------+--------+--------+------------------+--------+ ICA Prox  322     55      40-59%  heterogenous               +----------+--------+--------+--------+------------------+--------+ ICA Mid   135     28                                         +----------+--------+--------+--------+------------------+--------+ ICA Distal74      17                                         +----------+--------+--------+--------+------------------+--------+ ECA       241     9       >50%                               +----------+--------+--------+--------+------------------+--------+ +----------+--------+-------+----------------+-------------------+           PSV cm/sEDV cmsDescribe        Arm Pressure (mmHG) +----------+--------+-------+----------------+-------------------+ Subclavian152     5      Multiphasic, WNL110                 +----------+--------+-------+----------------+-------------------+ +---------+--------+--+--------+--+---------+ VertebralPSV cm/s68EDV cm/s12Antegrade +---------+--------+--+--------+--+---------+  Left Carotid Findings: +----------+--------+--------+--------+------------------+--------+           PSV  cm/sEDV cm/sStenosisPlaque DescriptionComments +----------+--------+--------+--------+------------------+--------+ CCA Prox  90      17                                         +----------+--------+--------+--------+------------------+--------+ CCA Mid   61      12                                         +----------+--------+--------+--------+------------------+--------+  CCA Distal43      11                                         +----------+--------+--------+--------+------------------+--------+ ICA Prox  135     26      1-39%   heterogenous               +----------+--------+--------+--------+------------------+--------+ ICA Mid   85      23                                         +----------+--------+--------+--------+------------------+--------+ ICA Distal106     35                                         +----------+--------+--------+--------+------------------+--------+ ECA       261     32      >50%                               +----------+--------+--------+--------+------------------+--------+ +----------+--------+--------+----------------+-------------------+           PSV cm/sEDV cm/sDescribe        Arm Pressure (mmHG) +----------+--------+--------+----------------+-------------------+ [id number]              Multiphasic, WNL100                 +----------+--------+--------+----------------+-------------------+ +---------+--------+--+--------+--+---------+ VertebralPSV cm/s71EDV cm/s12Antegrade +---------+--------+--+--------+--+---------+   Summary: Right Carotid: Velocities in the right ICA are consistent with a 40-59%                stenosis. Left Carotid: Velocities in the left ICA are consistent with a 1-39% stenosis. Vertebrals:  Bilateral vertebral arteries demonstrate antegrade flow. Subclavians: Normal flow hemodynamics were seen in bilateral subclavian              arteries. *See table(s) above for measurements and  observations. Suggest follow up study in 12 months. Electronically signed by Charlton Haws MD on 08/25/2022 at 12:30:45 PM.    Final     No results found for this or any previous visit (from the past 2160 hour(s)).      Garner Nash, MD, MS

## 2022-11-03 ENCOUNTER — Encounter: Payer: Self-pay | Admitting: Internal Medicine

## 2022-11-16 ENCOUNTER — Other Ambulatory Visit: Payer: Self-pay | Admitting: Internal Medicine

## 2022-11-16 DIAGNOSIS — E7849 Other hyperlipidemia: Secondary | ICD-10-CM

## 2022-11-16 DIAGNOSIS — I10 Essential (primary) hypertension: Secondary | ICD-10-CM

## 2022-12-05 ENCOUNTER — Encounter: Payer: Self-pay | Admitting: Internal Medicine

## 2022-12-05 ENCOUNTER — Ambulatory Visit (AMBULATORY_SURGERY_CENTER): Payer: Medicare Other | Admitting: *Deleted

## 2022-12-05 VITALS — Ht 70.0 in | Wt 155.0 lb

## 2022-12-05 DIAGNOSIS — Z86010 Personal history of colonic polyps: Secondary | ICD-10-CM

## 2022-12-05 MED ORDER — NA SULFATE-K SULFATE-MG SULF 17.5-3.13-1.6 GM/177ML PO SOLN
1.0000 | Freq: Once | ORAL | 0 refills | Status: AC
Start: 2022-12-05 — End: 2022-12-05

## 2022-12-05 NOTE — Progress Notes (Signed)
Pt's name and DOB verified at the beginning of the pre-visit.  Pt denies any difficulty with ambulating,sitting, laying down or rolling side to side Gave both [facility] main # and MD on call # prior to instructions.  No egg or soy allergy known to patient  No issues known to pt with past sedation with any surgeries or procedures Pt denies having issues being intubated Pt has no issues moving head neck or swallowing No FH of Malignant Hyperthermia Pt is not on diet pills Pt is not on home 02  Pt is not on blood thinners  Pt denies issues with constipation  Pt is not on dialysis Pt denise any abnormal heart rhythms  Pt denies any upcoming cardiac testing Pt encouraged to use to use Singlecare or Goodrx to reduce cost  Patient's chart reviewed by Cathlyn Parsons CNRA prior to pre-visit and patient appropriate for the [facility].  Pre-visit completed and red dot placed by patient's name on their procedure day (on provider's schedule).  . Visit by phone Pt states weight is 155 lb Instructed pt why it is important to and  to call if they have any changes in health or new medications. Directed them to the # given and on instructions.   Pt states they will.  Instructions reviewed with pt and pt states understanding. Instructed to review again prior to procedure. Pt states they will.  Instructions sent by mail with coupon and by my chart

## 2022-12-17 ENCOUNTER — Encounter: Payer: Self-pay | Admitting: Certified Registered Nurse Anesthetist

## 2022-12-22 ENCOUNTER — Encounter: Payer: Self-pay | Admitting: Internal Medicine

## 2022-12-22 ENCOUNTER — Ambulatory Visit: Payer: Medicare Other | Admitting: Internal Medicine

## 2022-12-22 VITALS — BP 125/65 | HR 56 | Temp 98.0°F | Resp 9 | Ht 70.0 in | Wt 155.0 lb

## 2022-12-22 DIAGNOSIS — D122 Benign neoplasm of ascending colon: Secondary | ICD-10-CM | POA: Diagnosis not present

## 2022-12-22 DIAGNOSIS — D120 Benign neoplasm of cecum: Secondary | ICD-10-CM | POA: Diagnosis not present

## 2022-12-22 DIAGNOSIS — K635 Polyp of colon: Secondary | ICD-10-CM | POA: Diagnosis not present

## 2022-12-22 DIAGNOSIS — Z860100 Personal history of colon polyps, unspecified: Secondary | ICD-10-CM

## 2022-12-22 DIAGNOSIS — D125 Benign neoplasm of sigmoid colon: Secondary | ICD-10-CM

## 2022-12-22 DIAGNOSIS — Z09 Encounter for follow-up examination after completed treatment for conditions other than malignant neoplasm: Secondary | ICD-10-CM | POA: Diagnosis not present

## 2022-12-22 DIAGNOSIS — D123 Benign neoplasm of transverse colon: Secondary | ICD-10-CM | POA: Diagnosis not present

## 2022-12-22 MED ORDER — SODIUM CHLORIDE 0.9 % IV SOLN: 500.0000 mL | Freq: Once | INTRAVENOUS | Status: DC

## 2022-12-22 NOTE — Op Note (Signed)
[facility] Patient Name: Gabriel Pittman Procedure Date: 12/22/2022 9:16 AM MRN: [id number] Endoscopist: Madelyn Brunner [place] , , [id number] Age: 67 Referring MD:  Date of Birth: 10/17/1955 Gender: Male Account #: [id number] Procedure:                Colonoscopy Indications:              High risk colon cancer surveillance: Personal                            history of colonic polyps Medicines:                Monitored Anesthesia Care Procedure:                Pre-Anesthesia Assessment:                           - Prior to the procedure, a History and Physical                            was performed, and patient medications and                            allergies were reviewed. The patient's tolerance of                            previous anesthesia was also reviewed. The risks                            and benefits of the procedure and the sedation                            options and risks were discussed with the patient.                            All questions were answered, and informed consent                            was obtained. Prior Anticoagulants: The patient has                            taken no anticoagulant or antiplatelet agents. ASA                            Grade Assessment: III - A patient with severe                            systemic disease. After reviewing the risks and                            benefits, the patient was deemed in satisfactory                            condition to undergo the procedure.  After obtaining informed consent, the colonoscope                            was passed under direct vision. Throughout the                            procedure, the patient's blood pressure, pulse, and                            oxygen saturations were monitored continuously. The                            Olympus Scope [id number] was introduced through the                            anus and advanced to the the  terminal ileum. The                            colonoscopy was performed without difficulty. The                            patient tolerated the procedure well. The quality                            of the bowel preparation was good. The terminal                            ileum, ileocecal valve, appendiceal orifice, and                            rectum were photographed. Scope In: 9:20:26 AM Scope Out: 9:45:18 AM Scope Withdrawal Time: 0 hours 20 minutes 3 seconds  Total Procedure Duration: 0 hours 24 minutes 52 seconds  Findings:                 The terminal ileum appeared normal.                           Nine sessile polyps were found in the transverse                            colon, ascending colon and cecum. The polyps were 3                            to 6 mm in size. These polyps were removed with a                            cold snare. Resection and retrieval were complete.                           Two sessile polyps were found in the sigmoid colon.                            The polyps were  8 to 10 mm in size. These polyps                            were removed with a cold snare. Resection and                            retrieval were complete.                           Multiple diverticula were found in the sigmoid                            colon.                           Non-bleeding internal hemorrhoids were found during                            retroflexion. Complications:            No immediate complications. Estimated Blood Loss:     Estimated blood loss was minimal. Impression:               - The examined portion of the ileum was normal.                           - Nine 3 to 6 mm polyps in the transverse colon, in                            the ascending colon and in the cecum, removed with                            a cold snare. Resected and retrieved.                           - Two 8 to 10 mm polyps in the sigmoid colon,                             removed with a cold snare. Resected and retrieved.                           - Diverticulosis in the sigmoid colon.                           - Non-bleeding internal hemorrhoids. Recommendation:           - Discharge patient to home (with escort).                           - Await pathology results.                           - The findings and recommendations were discussed                            with the patient. Dr Alan Ripper  Norwood Levo "Eulah Pont,  12/22/2022 9:56:04 AM

## 2022-12-22 NOTE — Progress Notes (Signed)
Pt's states no medical or surgical changes since previsit or office visit. 

## 2022-12-22 NOTE — Progress Notes (Signed)
GASTROENTEROLOGY PROCEDURE H&P NOTE   Primary Care Physician: Garnette Gunner, MD    Reason for Procedure:   History of colon polyps  Plan:    Colonoscopy  Patient is appropriate for endoscopic procedure(s) in the ambulatory ([facility]) setting.  The nature of the procedure, as well as the risks, benefits, and alternatives were carefully and thoroughly reviewed with the patient. Ample time for discussion and questions allowed. The patient understood, was satisfied, and agreed to proceed.     HPI: Gabriel Pittman is a 67 y.o. male who presents for colonoscopy for history of colon polyps. Denies blood in stools, changes in bowel habits, or unintentional weight loss. Denies family history of colon cancer. Last colonoscopy 01/05/15 with 3 small polyps (tubular adenomas, HP).  Past Medical History:  Diagnosis Date   Allergy    Anxiety    Arthritis    Cancer (HCC)    Phreesia 12/10/2019   Carotid artery disease (HCC)    Coarctation of aorta    a. s/p remote surgery.   Depression    Essential hypertension    Former tobacco use    Hyperlipidemia    Melanoma (HCC) 1990   RIGHT posterior shoulder   Polysubstance abuse (HCC)    a. former alcoholic, drug use ("all of them"). Sober for 11 years as of 2019.   Screening for malignant neoplasm of prostate 10/27/2022   Tubular adenoma of colon 12/2014    Past Surgical History:  Procedure Laterality Date   AORTA SURGERY     congenital narrowing repair age 48 1/67 years old   COLONOSCOPY     with polyps   FOOT SURGERY Left    MELANOMA EXCISION Right 03/13/1988   RIGHT posterior shoulder    Prior to Admission medications   Medication Sig Start Date End Date Taking? Authorizing Provider  ezetimibe (ZETIA) 10 MG tablet TAKE 1 TABLET BY MOUTH  DAILY 08/09/21  Yes Janeece Agee, NP  levocetirizine (XYZAL) 5 MG tablet Take 5 mg by mouth at bedtime. 11/16/22  Yes [provider]  lisinopril (ZESTRIL) 10 MG tablet TAKE 1 TABLET BY  MOUTH DAILY Patient taking differently: Takes 5 mg QD 11/17/22  Yes Pricilla Riffle, MD  rosuvastatin (CRESTOR) 20 MG tablet TAKE 1 TABLET BY MOUTH DAILY 11/17/22  Yes Pricilla Riffle, MD  aspirin EC 81 MG tablet Take 1 tablet (81 mg total) by mouth daily. 07/16/17   Dunn, Tacey Ruiz, PA-C  Multiple Vitamin (MULTIVITAMIN) capsule Take 1 capsule by mouth daily.    [provider]  pimecrolimus (ELIDEL) 1 % cream SMARTSIG:Sparingly Topical Twice Daily 11/22/22   [provider]  tacrolimus (PROTOPIC) 0.1 % ointment SMARTSIG:Sparingly Topical Twice Daily 11/22/22   [provider]  valACYclovir (VALTREX) 1000 MG tablet At the start of an outbreak take 1 tablet daily for 5 days. 11/14/21   Wallis Bamberg, PA-C    Current Outpatient Medications  Medication Sig Dispense Refill   ezetimibe (ZETIA) 10 MG tablet TAKE 1 TABLET BY MOUTH  DAILY 90 tablet 3   levocetirizine (XYZAL) 5 MG tablet Take 5 mg by mouth at bedtime.     lisinopril (ZESTRIL) 10 MG tablet TAKE 1 TABLET BY MOUTH DAILY (Patient taking differently: Takes 5 mg QD) 100 tablet 0   rosuvastatin (CRESTOR) 20 MG tablet TAKE 1 TABLET BY MOUTH DAILY 100 tablet 1   aspirin EC 81 MG tablet Take 1 tablet (81 mg total) by mouth daily. 90 tablet 3  Multiple Vitamin (MULTIVITAMIN) capsule Take 1 capsule by mouth daily.     pimecrolimus (ELIDEL) 1 % cream SMARTSIG:Sparingly Topical Twice Daily     tacrolimus (PROTOPIC) 0.1 % ointment SMARTSIG:Sparingly Topical Twice Daily     valACYclovir (VALTREX) 1000 MG tablet At the start of an outbreak take 1 tablet daily for 5 days. 30 tablet 5   Current Facility-Administered Medications  Medication Dose Route Frequency Provider Last Rate Last Admin   0.9 %  sodium chloride infusion  500 mL Intravenous Once Imogene Burn, MD        Allergies as of 12/22/2022 - Review Complete 12/22/2022  Allergen Reaction Noted   Codeine Nausea Only 07/26/2016   Levofloxacin Hives 07/01/2011    Family History   Problem Relation Age of Onset   Heart disease Mother    Diabetes Mother    Hyperlipidemia Mother    Heart disease Father    Diabetes Father    Hypertension Father    Early death Father    Cancer Sister        per patient of the bone   Hyperlipidemia Sister    Diabetes Sister    Heart disease Brother    Colon cancer Neg Hx    Colon polyps Neg Hx    Esophageal cancer Neg Hx    Stomach cancer Neg Hx    Rectal cancer Neg Hx     Social History   Socioeconomic History   Marital status: Married    Spouse name: Harriett Sine   Number of children: 0   Years of education: Not on file   Highest education level: Some college, no degree  Occupational History   Occupation: retired  Tobacco Use   Smoking status: Former    Current packs/day: 0.00    Average packs/day: 0.5 packs/day for 20.0 years (10.0 ttl pk-yrs)    Types: Cigarettes    Start date: 03/14/1995    Quit date: 03/14/2015    Years since quitting: 7.7   Smokeless tobacco: Former  Building services engineer status: Every Day  Substance and Sexual Activity   Alcohol use: Not Currently    Comment: 2007   Drug use: Not Currently    Types: Hydrocodone    Comment: 2007   Sexual activity: Yes    Birth control/protection: None  Other Topics Concern   Not on file  Social History Narrative   Lives with his wife.  She has 2 children from a previous relationship.   Exercise at gym cardio and strength training 3-4 times/week   Social Determinants of Health   Financial Resource Strain: Low Risk  (10/23/2022)   Overall Financial Resource Strain (CARDIA)    Difficulty of Paying Living Expenses: Not very hard  Food Insecurity: No Food Insecurity (10/23/2022)   Hunger Vital Sign    Worried About Running Out of Food in the Last Year: Never true    Ran Out of Food in the Last Year: Never true  Transportation Needs: No Transportation Needs (10/23/2022)   PRAPARE - Administrator, Civil Service (Medical): No    Lack of Transportation  (Non-Medical): No  Physical Activity: Sufficiently Active (10/23/2022)   Exercise Vital Sign    Days of Exercise per Week: 3 days    Minutes of Exercise per Session: 60 min  Stress: Stress Concern Present (10/23/2022)   [employer] of Occupational Health - Occupational Stress Questionnaire    Feeling of Stress : To some extent  Social Connections:  Unknown (10/23/2022)   Social Connection and Isolation Panel [NHANES]    Frequency of Communication with Friends and Family: Patient declined    Frequency of Social Gatherings with Friends and Family: Twice a week    Attends Religious Services: Never    Database administrator or Organizations: Yes    Attends Engineer, structural: More than 4 times per year    Marital Status: Married  Catering manager Violence: Not on file    Physical Exam: Vital signs in last 24 hours: BP (!) 128/52   Pulse 68   Temp 98 F (36.7 C) (Temporal)   Ht 5\' 10"  (1.778 m)   Wt 155 lb (70.3 kg)   SpO2 99%   BMI 22.24 kg/m  GEN: NAD EYE: Sclerae anicteric ENT: MMM CV: Non-tachycardic Pulm: No increased work of breathing GI: Soft, NT/ND NEURO:  Alert & Oriented   Eulah Pont, MD [facility]  12/22/2022 8:42 AM

## 2022-12-22 NOTE — Progress Notes (Signed)
Report given to PACU, vss 

## 2022-12-22 NOTE — Patient Instructions (Signed)
Handouts given on polyps, hemorrhoids and diverticulosis.  YOU HAD AN ENDOSCOPIC PROCEDURE TODAY AT THE [facility]:   Refer to the procedure report that was given to you for any specific questions about what was found during the examination.  If the procedure report does not answer your questions, please call your gastroenterologist to clarify.  If you requested that your care partner not be given the details of your procedure findings, then the procedure report has been included in a sealed envelope for you to review at your convenience later.  YOU SHOULD EXPECT: Some feelings of bloating in the abdomen. Passage of more gas than usual.  Walking can help get rid of the air that was put into your GI tract during the procedure and reduce the bloating. If you had a lower endoscopy (such as a colonoscopy or flexible sigmoidoscopy) you may notice spotting of blood in your stool or on the toilet paper. If you underwent a bowel prep for your procedure, you may not have a normal bowel movement for a few days.  Please Note:  You might notice some irritation and congestion in your nose or some drainage.  This is from the oxygen used during your procedure.  There is no need for concern and it should clear up in a day or so.  SYMPTOMS TO REPORT IMMEDIATELY:  Following lower endoscopy (colonoscopy or flexible sigmoidoscopy):  Excessive amounts of blood in the stool  Significant tenderness or worsening of abdominal pains  Swelling of the abdomen that is new, acute  Fever of 100F or higher  Following upper endoscopy (EGD)  Vomiting of blood or coffee ground material  New chest pain or pain under the shoulder blades  Painful or persistently difficult swallowing  New shortness of breath  Fever of 100F or higher  Black, tarry-looking stools  For urgent or emergent issues, a gastroenterologist can be reached at any hour by calling [phone]. Do not use MyChart messaging for urgent  concerns.    DIET:  We do recommend a small meal at first, but then you may proceed to your regular diet.  Drink plenty of fluids but you should avoid alcoholic beverages for 24 hours.  ACTIVITY:  You should plan to take it easy for the rest of today and you should NOT DRIVE or use heavy machinery until tomorrow (because of the sedation medicines used during the test).    FOLLOW UP: Our staff will call the number listed on your records the next business day following your procedure.  We will call around 7:15- 8:00 am to check on you and address any questions or concerns that you may have regarding the information given to you following your procedure. If we do not reach you, we will leave a message.     If any biopsies were taken you will be contacted by phone or by letter within the next 1-3 weeks.  Please call us at [phone] if you have not heard about the biopsies in 3 weeks.    SIGNATURES/CONFIDENTIALITY: You and/or your care partner have signed paperwork which will be entered into your electronic medical record.  These signatures attest to the fact that that the information above on your After Visit Summary has been reviewed and is understood.  Full responsibility of the confidentiality of this discharge information lies with you and/or your care-partner.

## 2022-12-22 NOTE — Progress Notes (Signed)
Called to room to assist during endoscopic procedure.  Patient ID and intended procedure confirmed with present staff. Received instructions for my participation in the procedure from the performing physician.  

## 2022-12-23 ENCOUNTER — Encounter: Payer: Self-pay | Admitting: *Deleted

## 2022-12-23 ENCOUNTER — Ambulatory Visit
Admission: EM | Admit: 2022-12-23 | Discharge: 2022-12-23 | Disposition: A | Discharge status: Home / Self Care | Payer: Medicare Other | Attending: Physician Assistant | Admitting: Physician Assistant

## 2022-12-23 ENCOUNTER — Other Ambulatory Visit: Payer: Self-pay

## 2022-12-23 DIAGNOSIS — L309 Dermatitis, unspecified: Secondary | ICD-10-CM

## 2022-12-23 MED ORDER — METHYLPREDNISOLONE ACETATE 80 MG/ML IJ SUSP
40.0000 mg | Freq: Once | INTRAMUSCULAR | Status: AC
  Administered 2022-12-23: 40 mg via INTRAMUSCULAR

## 2022-12-23 NOTE — ED Triage Notes (Signed)
Pt reports itchy rash to his back x 9-10 days. States area is getting bigger. Reports similar rash last year around this time. Taking benadryl for itching and cream given to him last year (betamethasone) which has made it worse.

## 2022-12-23 NOTE — ED Provider Notes (Signed)
[facility]    CSN: [id number] Arrival date & time: 12/23/22  1358      History   Chief Complaint Chief Complaint  Patient presents with   Rash    HPI Gabriel Pittman is a 67 y.o. male.   Patient here today for evaluation of rash to his back that he has had for the last 9 to 10 days.  He reports similar presentation last year around this time.  He has been using Benadryl and topical cream which has not seemed to make rash worse.  He has not any fever.  He denies any shortness of breath or trouble swallowing.  He is unaware of any new products that may have caused dermatitis.  The history is provided by the patient.  Rash Associated symptoms: no fever and no shortness of breath     Past Medical History:  Diagnosis Date   Allergy    Anxiety    Arthritis    Cancer (HCC)    Phreesia 12/10/2019   Carotid artery disease (HCC)    Coarctation of aorta    a. s/p remote surgery.   Depression    Essential hypertension    Former tobacco use    Hyperlipidemia    Melanoma (HCC) 1990   RIGHT posterior shoulder   Polysubstance abuse (HCC)    a. former alcoholic, drug use ("all of them"). Sober for 11 years as of 2019.   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 Authorizing Provider  ezetimibe (ZETIA) 10 MG tablet TAKE 1 TABLET BY MOUTH  DAILY 08/09/21  Yes Janeece Agee, NP  lisinopril (ZESTRIL) 10 MG tablet TAKE 1 TABLET BY MOUTH DAILY Patient taking differently: Takes 5 mg QD 11/17/22  Yes Pricilla Riffle, MD  rosuvastatin (CRESTOR) 20 MG tablet TAKE 1 TABLET BY MOUTH DAILY 11/17/22  Yes Pricilla Riffle, MD  aspirin EC 81 MG tablet Take 1 tablet (81 mg total) by mouth daily. Patient not taking: Reported on 12/25/2022 07/16/17   Laurann Montana, PA-C  levocetirizine (XYZAL) 5 MG tablet Take 5 mg by mouth at bedtime. 11/16/22   [provider]  Multiple Vitamin (MULTIVITAMIN) capsule Take 1 capsule by mouth daily.    [provider]  pimecrolimus (ELIDEL) 1 % cream SMARTSIG:Sparingly Topical Twice Daily 11/22/22   [provider]  tacrolimus (PROTOPIC) 0.1 % ointment SMARTSIG:Sparingly Topical Twice Daily Patient not taking: Reported on 12/25/2022 11/22/22   [provider]  valACYclovir (VALTREX) 1000 MG tablet  At the start of an outbreak take 1 tablet daily for 5 days. 11/14/21   Wallis Bamberg, PA-C    Family History Family History  Problem Relation Age of Onset   Heart disease Mother    Diabetes Mother    Hyperlipidemia Mother    Heart disease Father    Diabetes Father    Hypertension Father    Early death Father    Cancer Sister        per patient of the bone   Hyperlipidemia Sister    Diabetes Sister    Heart disease Brother    Colon cancer Neg Hx    Colon polyps Neg Hx    Esophageal cancer Neg Hx    Stomach cancer Neg Hx    Rectal cancer Neg Hx     Social History Social History   Tobacco Use   Smoking status: Former    Current packs/day: 0.00    Average packs/day: 0.5 packs/day for 20.0 years (10.0 ttl  pk-yrs)    Types: Cigarettes    Start date: 03/14/1995    Quit date: 03/14/2015    Years since quitting: 7.8   Smokeless tobacco: Former  Advertising account planner   Vaping status: Former  Substance Use Topics   Alcohol use: Not Currently    Comment: 2007   Drug use: Not Currently    Types: Hydrocodone    Comment: 2007     Allergies   Codeine and Levofloxacin   Review of Systems Review of Systems  Constitutional:  Negative for chills and fever.  HENT:  Negative for trouble swallowing.   Eyes:  Negative for discharge and redness.  Respiratory:  Negative for shortness of breath.   Skin:  Positive for rash. Negative for color change.  Neurological:  Negative for numbness.     Physical Exam Triage Vital Signs ED Triage Vitals  Encounter Vitals Group     BP 12/23/22 1429 (!) 170/69     Systolic BP Percentile --      Diastolic BP Percentile --      Pulse Rate 12/23/22 1429 62     Resp 12/23/22 1429 16     Temp 12/23/22 1429 97.9 F (36.6 C)     Temp Source 12/23/22 1429 Oral     SpO2 12/23/22 1429 95 %     Weight --      Height --      Head Circumference --      Peak Flow --      Pain Score 12/23/22 1425 3     Pain Loc --      Pain Education --      Exclude from Growth Chart --    No data found.  Updated Vital Signs BP (!) 170/69 (BP Location: Left Arm)   Pulse 62   Temp 97.9 F (36.6 C) (Oral)   Resp 16   SpO2 95%      Physical Exam Vitals and nursing note reviewed.  Constitutional:      General: He is not in acute distress.    Appearance: Normal appearance. He is not ill-appearing.  HENT:     Head: Normocephalic and atraumatic.  Eyes:     Conjunctiva/sclera: Conjunctivae normal.  Cardiovascular:     Rate and Rhythm: Normal rate.  Pulmonary:     Effort: Pulmonary effort is normal. No respiratory distress.  Skin:    Comments: Scattered papular lesions that are mildly erythematous noted to back  Neurological:  Mental Status: He is alert.  Psychiatric:         Mood and Affect: Mood normal.        Behavior: Behavior normal.        Thought Content: Thought content normal.      UC Treatments / Results  Labs (all labs ordered are listed, but only abnormal results are displayed) Labs Reviewed - No data to display  EKG   Radiology No results found.  Procedures Procedures (including critical care time)  Medications Ordered in UC Medications  methylPREDNISolone acetate (DEPO-MEDROL) injection 40 mg (40 mg Intramuscular Given 12/23/22 1442)    Initial Impression / Assessment and Plan / UC Course  I have reviewed the triage vital signs and the nursing notes.  Pertinent labs & imaging results that were available during my care of the patient were reviewed by me and considered in my medical decision making (see chart for details).    Unclear etiology of dermatitis but will treat with methylprednisolone injection in office to hopefully improve symptoms.  Recommended follow-up if no gradual improvement or with any further concerns.  Final Clinical Impressions(s) / UC Diagnoses   Final diagnoses:  Dermatitis   Discharge Instructions   None    ED Prescriptions   None    PDMP not reviewed this encounter.   Tomi Bamberger, PA-C 01/02/23 1734

## 2022-12-25 ENCOUNTER — Telehealth: Payer: Self-pay

## 2022-12-25 ENCOUNTER — Ambulatory Visit (INDEPENDENT_AMBULATORY_CARE_PROVIDER_SITE_OTHER): Payer: Medicare Other

## 2022-12-25 DIAGNOSIS — Z0000 Encounter for general adult medical examination without abnormal findings: Secondary | ICD-10-CM | POA: Diagnosis not present

## 2022-12-25 NOTE — Telephone Encounter (Signed)
  Follow up Call-     12/22/2022    8:07 AM  Call back number  Post procedure Call Back phone  # [phone]  Permission to leave phone message Yes     Patient questions:  Do you have a fever, pain , or abdominal swelling? No. Pain Score  0 *  Have you tolerated food without any problems? Yes.    Have you been able to return to your normal activities? Yes.    Do you have any questions about your discharge instructions: Diet   No. Medications  No. Follow up visit  No.  Do you have questions or concerns about your Care? No.  Actions: * If pain score is 4 or above: No action needed, pain <4.

## 2022-12-25 NOTE — Progress Notes (Signed)
Subjective:   Gabriel Pittman is a 67 y.o. male who presents for an Initial Medicare Annual Wellness Visit.  Visit Complete: Virtual I connected with  Gabriel Pittman on 12/25/22 by a audio enabled telemedicine application and verified that I am speaking with the correct person using two identifiers.  Patient Location: Home  Provider Location: Office/Clinic  I discussed the limitations of evaluation and management by telemedicine. The patient expressed understanding and agreed to proceed.  Vital Signs: Because this visit was a virtual/telehealth visit, some criteria may be missing or patient reported. Any vitals not documented were not able to be obtained and vitals that have been documented are patient reported.  Patient Medicare AWV questionnaire was completed by the patient on 12/21/2022; I have confirmed that all information answered by patient is correct and no changes since this date.  Cardiac Risk Factors include: advanced age (>39men, >24 women);dyslipidemia;hypertension;male gender     Objective:    Today's Vitals   There is no height or weight on file to calculate BMI.     12/25/2022    2:16 PM 12/12/2019    8:57 AM 12/21/2014    9:06 AM  Advanced Directives  Does Patient Have a Medical Advance Directive? No No No    Current Medications (verified) Outpatient Encounter Medications as of 12/25/2022  Medication Sig   ezetimibe (ZETIA) 10 MG tablet TAKE 1 TABLET BY MOUTH  DAILY   levocetirizine (XYZAL) 5 MG tablet Take 5 mg by mouth at bedtime.   lisinopril (ZESTRIL) 10 MG tablet TAKE 1 TABLET BY MOUTH DAILY (Patient taking differently: Takes 5 mg QD)   Multiple Vitamin (MULTIVITAMIN) capsule Take 1 capsule by mouth daily.   pimecrolimus (ELIDEL) 1 % cream SMARTSIG:Sparingly Topical Twice Daily   rosuvastatin (CRESTOR) 20 MG tablet TAKE 1 TABLET BY MOUTH DAILY   valACYclovir (VALTREX) 1000 MG tablet At the start of an outbreak take 1 tablet daily for 5 days.    aspirin EC 81 MG tablet Take 1 tablet (81 mg total) by mouth daily. (Patient not taking: Reported on 12/25/2022)   tacrolimus (PROTOPIC) 0.1 % ointment SMARTSIG:Sparingly Topical Twice Daily (Patient not taking: Reported on 12/25/2022)   No facility-administered encounter medications on file as of 12/25/2022.    Allergies (verified) Codeine and Levofloxacin   History: Past Medical History:  Diagnosis Date   Allergy    Anxiety    Arthritis    Cancer (HCC)    Phreesia 12/10/2019   Carotid artery disease (HCC)    Coarctation of aorta    a. s/p remote surgery.   Depression    Essential hypertension    Former tobacco use    Hyperlipidemia    Melanoma (HCC) 1990   RIGHT posterior shoulder   Polysubstance abuse (HCC)    a. former alcoholic, drug use ("all of them"). Sober for 11 years as of 2019.   Screening for malignant neoplasm of prostate 10/27/2022   Tubular adenoma of colon 12/2014   Past Surgical History:  Procedure Laterality Date   AORTA SURGERY     congenital narrowing repair age 84 1/67 years old   COLONOSCOPY     with polyps   FOOT SURGERY Left    MELANOMA EXCISION Right 03/13/1988   RIGHT posterior shoulder   Family History  Problem Relation Age of Onset   Heart disease Mother    Diabetes Mother    Hyperlipidemia Mother    Heart disease Father    Diabetes Father  Hypertension Father    Early death Father    Cancer Sister        per patient of the bone   Hyperlipidemia Sister    Diabetes Sister    Heart disease Brother    Colon cancer Neg Hx    Colon polyps Neg Hx    Esophageal cancer Neg Hx    Stomach cancer Neg Hx    Rectal cancer Neg Hx    Social History   Socioeconomic History   Marital status: Married    Spouse name: Harriett Sine   Number of children: 0   Years of education: Not on file   Highest education level: Some college, no degree  Occupational History   Occupation: retired  Tobacco Use   Smoking status: Former    Current packs/day:  0.00    Average packs/day: 0.5 packs/day for 20.0 years (10.0 ttl pk-yrs)    Types: Cigarettes    Start date: 03/14/1995    Quit date: 03/14/2015    Years since quitting: 7.7   Smokeless tobacco: Former  Building services engineer status: Former  Substance and Sexual Activity   Alcohol use: Not Currently    Comment: 2007   Drug use: Not Currently    Types: Hydrocodone    Comment: 2007   Sexual activity: Yes    Birth control/protection: None  Other Topics Concern   Not on file  Social History Narrative   Lives with his wife.  She has 2 children from a previous relationship.   Exercise at gym cardio and strength training 3-4 times/week   Social Determinants of Health   Financial Resource Strain: Low Risk  (12/21/2022)   Overall Financial Resource Strain (CARDIA)    Difficulty of Paying Living Expenses: Not hard at all  Food Insecurity: No Food Insecurity (12/21/2022)   Hunger Vital Sign    Worried About Running Out of Food in the Last Year: Never true    Ran Out of Food in the Last Year: Never true  Transportation Needs: No Transportation Needs (12/21/2022)   PRAPARE - Administrator, Civil Service (Medical): No    Lack of Transportation (Non-Medical): No  Physical Activity: Sufficiently Active (12/21/2022)   Exercise Vital Sign    Days of Exercise per Week: 3 days    Minutes of Exercise per Session: 60 min  Stress: No Stress Concern Present (12/21/2022)   [employer] of Occupational Health - Occupational Stress Questionnaire    Feeling of Stress : Only a little  Recent Concern: Stress - Stress Concern Present (10/23/2022)   [employer] of Occupational Health - Occupational Stress Questionnaire    Feeling of Stress : To some extent  Social Connections: Moderately Isolated (12/21/2022)   Social Connection and Isolation Panel [NHANES]    Frequency of Communication with Friends and Family: Once a week    Frequency of Social Gatherings with Friends and Family:  Once a week    Attends Religious Services: Never    Database administrator or Organizations: Yes    Attends Engineer, structural: 1 to 4 times per year    Marital Status: Married    Tobacco Counseling Counseling given: Not Answered   Clinical Intake:  Pre-visit preparation completed: Yes  Pain : No/denies pain     Nutritional Risks: None Diabetes: No  How often do you need to have someone help you when you read instructions, pamphlets, or other written materials from your doctor or pharmacy?: 1 -  Never  Interpreter Needed?: No  Information entered by :: NAllen LPN   Activities of Daily Living    12/21/2022    8:56 AM  In your present state of health, do you have any difficulty performing the following activities:  Hearing? 0  Vision? 0  Difficulty concentrating or making decisions? 0  Walking or climbing stairs? 0  Dressing or bathing? 0  Doing errands, shopping? 0  Preparing Food and eating ? N  Using the Toilet? N  In the past six months, have you accidently leaked urine? N  Do you have problems with loss of bowel control? N  Managing your Medications? N  Managing your Finances? N  Housekeeping or managing your Housekeeping? N    Patient Care Team: Garnette Gunner, MD as PCP - General (Family Medicine) Pricilla Riffle, MD as PCP - Cardiology (Cardiology)  Indicate any recent Medical Services you may have received from other than [facility] providers in the past year (date may be approximate).     Assessment:   This is a routine wellness examination for Duante.  Hearing/Vision screen Hearing Screening - Comments:: Denies hearing issues Vision Screening - Comments:: Regular eye exams, [facility]   Goals Addressed             This Visit's Progress    Patient Stated       12/25/2022, stay healthy and get to the bottom of rash       Depression Screen    12/25/2022    2:17 PM 10/27/2022    1:09 PM 12/12/2019    8:50 AM 12/31/2018   10:13 AM  10/31/2018    2:06 PM 08/03/2017    9:01 AM 06/18/2017   10:51 AM  PHQ 2/9 Scores  PHQ - 2 Score 0 1 0 0 0 0 0  PHQ- 9 Score 0 2   0      Fall Risk    12/21/2022    8:56 AM 10/27/2022    1:09 PM 12/12/2019    8:50 AM 12/31/2018   10:13 AM 10/31/2018    2:06 PM  Fall Risk   Falls in the past year? 0 0 0 1 0  Number falls in past yr: 0 0 0 0 0  Injury with Fall? 0 0 0 0   Risk for fall due to : Medication side effect      Follow up Falls prevention discussed;Falls evaluation completed  Falls evaluation completed  Falls evaluation completed    MEDICARE RISK AT HOME: Medicare Risk at Home Any stairs in or around the home?: No If so, are there any without handrails?: No Home free of loose throw rugs in walkways, pet beds, electrical cords, etc?: Yes Adequate lighting in your home to reduce risk of falls?: Yes Life alert?: No Use of a cane, walker or w/c?: No Grab bars in the bathroom?: No Shower chair or bench in shower?: No Elevated toilet seat or a handicapped toilet?: No  TIMED UP AND GO:  Was the test performed? No    Cognitive Function:        12/25/2022    2:17 PM 12/12/2019    8:53 AM  6CIT Screen  What Year? 0 points 0 points  What month? 0 points 0 points  What time? 0 points 0 points  Count back from 20 0 points 0 points  Months in reverse 0 points 0 points  Repeat phrase 2 points 0 points  Total Score 2 points 0 points  Immunizations Immunization History  Administered Date(s) Administered   Fluad Quad(high Dose 65+) 12/12/2019, 11/28/2022   Influenza Inj Mdck Quad Pf 11/22/2017   Influenza Split 11/12/2014, 11/11/2016, 11/22/2017   Influenza,inj,Quad PF,6+ Mos 11/15/2016, 11/21/2018   Influenza-Unspecified 11/11/2020, 11/24/2021   PFIZER Comirnaty(Gray Top)Covid-19 Tri-Sucrose Vaccine 06/12/2020   PFIZER(Purple Top)SARS-COV-2 Vaccination 04/25/2019, 05/18/2019   Pneumococcal Polysaccharide-23 12/12/2019   Tdap 12/16/2014   Unspecified SARS-COV-2  Vaccination 11/28/2022   Zoster, Live 12/17/2014    TDAP status: Up to date  Flu Vaccine status: Up to date  Pneumococcal vaccine status: Due, Education has been provided regarding the importance of this vaccine. Advised may receive this vaccine at local pharmacy or Health Dept. Aware to provide a copy of the vaccination record if obtained from local pharmacy or Health Dept. Verbalized acceptance and understanding.  Covid-19 vaccine status: Completed vaccines  Qualifies for Shingles Vaccine? Yes   Zostavax completed Yes   Shingrix Completed?: No.    Education has been provided regarding the importance of this vaccine. Patient has been advised to call insurance company to determine out of pocket expense if they have not yet received this vaccine. Advised may also receive vaccine at local pharmacy or Health Dept. Verbalized acceptance and understanding.  Screening Tests Health Maintenance  Topic Date Due   Zoster Vaccines- Shingrix (1 of 2) 11/26/2003   Pneumonia Vaccine 43+ Years old (2 of 2 - PCV) 12/11/2020   COVID-19 Vaccine (5 - 2023-24 season) 01/23/2023   Medicare Annual Wellness (AWV)  12/25/2023   DTaP/Tdap/Td (2 - Td or Tdap) 12/15/2024   Colonoscopy  12/22/2027   INFLUENZA VACCINE  Completed   Hepatitis C Screening  Completed   HPV VACCINES  Aged Out    Health Maintenance  Health Maintenance Due  Topic Date Due   Zoster Vaccines- Shingrix (1 of 2) 11/26/2003   Pneumonia Vaccine 104+ Years old (2 of 2 - PCV) 12/11/2020    Colorectal cancer screening: Type of screening: Colonoscopy. Completed 12/22/2022. Repeat every 5 years  Lung Cancer Screening: (Low Dose CT Chest recommended if Age 74-80 years, 20 pack-year currently smoking OR have quit w/in 15years.) does not qualify.   Lung Cancer Screening Referral: no  Additional Screening:  Hepatitis C Screening: does qualify; Completed 06/15/2015  Vision Screening: Recommended annual ophthalmology exams for early  detection of glaucoma and other disorders of the eye. Is the patient up to date with their annual eye exam?  Yes  Who is the provider or what is the name of the office in which the patient attends annual eye exams? [facility] If pt is not established with a provider, would they like to be referred to a provider to establish care? No .   Dental Screening: Recommended annual dental exams for proper oral hygiene  Diabetic Foot Exam: n/a  Community Resource Referral / Chronic Care Management: CRR required this visit?  No   CCM required this visit?  No    Plan:     I have personally reviewed and noted the following in the patient's chart:   Medical and social history Use of alcohol, tobacco or illicit drugs  Current medications and supplements including opioid prescriptions. Patient is not currently taking opioid prescriptions. Functional ability and status Nutritional status Physical activity Advanced directives List of other physicians Hospitalizations, surgeries, and ER visits in previous 12 months Vitals Screenings to include cognitive, depression, and falls Referrals and appointments  In addition, I have reviewed and discussed with patient certain preventive protocols, quality metrics,  and best practice recommendations. A written personalized care plan for preventive services as well as general preventive health recommendations were provided to patient.     Barb Merino, LPN   [id number]   After Visit Summary: (MyChart) Due to this being a telephonic visit, the after visit summary with patients personalized plan was offered to patient via MyChart   Nurse Notes: none

## 2022-12-25 NOTE — Patient Instructions (Signed)
Gabriel Pittman , Thank you for taking time to come for your Medicare Wellness Visit. I appreciate your ongoing commitment to your health goals. Please review the following plan we discussed and let me know if I can assist you in the future.   Referrals/Orders/Follow-Ups/Clinician Recommendations: none  This is a list of the screening recommended for you and due dates:  Health Maintenance  Topic Date Due   Medicare Annual Wellness Visit  Never done   Zoster (Shingles) Vaccine (1 of 2) 11/26/2003   Pneumonia Vaccine (2 of 2 - PCV) 12/11/2020   Flu Shot  10/12/2022   COVID-19 Vaccine (4 - 2023-24 season) 11/12/2022   DTaP/Tdap/Td vaccine (2 - Td or Tdap) 12/15/2024   Colon Cancer Screening  12/22/2027   Hepatitis C Screening  Completed   HPV Vaccine  Aged Out    Advanced directives: (ACP Link)Information on Advanced Care Planning can be found at [facility] [place] Advance Health Care Directives Advance Health Care Directives ([email or web address]   Next Medicare Annual Wellness Visit scheduled for next year: Yes  insert Preventive Care attachment Insert FALL PREVENTION attachment if needed

## 2022-12-26 ENCOUNTER — Encounter: Payer: Self-pay | Admitting: Internal Medicine

## 2022-12-26 DIAGNOSIS — L2989 Other pruritus: Secondary | ICD-10-CM | POA: Diagnosis not present

## 2022-12-26 DIAGNOSIS — T7840XA Allergy, unspecified, initial encounter: Secondary | ICD-10-CM | POA: Diagnosis not present

## 2022-12-26 LAB — SURGICAL PATHOLOGY

## 2023-01-16 DIAGNOSIS — T7840XA Allergy, unspecified, initial encounter: Secondary | ICD-10-CM | POA: Diagnosis not present

## 2023-01-25 ENCOUNTER — Other Ambulatory Visit: Payer: Self-pay | Admitting: Internal Medicine

## 2023-01-25 DIAGNOSIS — I10 Essential (primary) hypertension: Secondary | ICD-10-CM

## 2023-02-07 ENCOUNTER — Other Ambulatory Visit: Payer: Self-pay

## 2023-02-07 DIAGNOSIS — E7849 Other hyperlipidemia: Secondary | ICD-10-CM

## 2023-02-07 MED ORDER — EZETIMIBE 10 MG PO TABS: 10.0000 mg | ORAL_TABLET | Freq: Every day | ORAL | 0 refills | Status: DC

## 2023-02-21 ENCOUNTER — Other Ambulatory Visit: Payer: Self-pay | Admitting: Internal Medicine

## 2023-02-21 DIAGNOSIS — E7849 Other hyperlipidemia: Secondary | ICD-10-CM

## 2023-02-24 NOTE — Progress Notes (Signed)
Cardiology Office Note   Date:  03/05/2023   ID:  TYRAL HEINZEN, DOB 03-23-1955, MRN [id number]  PCP:  Garnette Gunner, MD  Cardiologist:   Dietrich Pates, MD    F/U of HTN and  Aortic pathology     History of Present Illness: Gabriel Pittman is a 67 y.o. male with a history of coarctation of the aorta (s/p remote repair), HTN, MVP  CV dz, polycustance abuse     I saw the pt in 2022   He was seen by Leda Gauze in the interval  The pt denies CP  Breathing is good   He is active     Biggest problem is skin reactions   ? If it an allergic response   Gets generalized reddening   Current Meds  Medication Sig   ezetimibe (ZETIA) 10 MG tablet Take 1 tablet (10 mg total) by mouth daily.   lisinopril (ZESTRIL) 10 MG tablet TAKE 1 TABLET BY MOUTH DAILY (Patient taking differently: Takes 5 mg QD)   Multiple Vitamin (MULTIVITAMIN) capsule Take 1 capsule by mouth daily.   rosuvastatin (CRESTOR) 20 MG tablet TAKE 1 TABLET BY MOUTH DAILY   valACYclovir (VALTREX) 1000 MG tablet At the start of an outbreak take 1 tablet daily for 5 days. (Patient taking differently: Take 1,000 mg by mouth daily as needed. At the start of an outbreak take 1 tablet daily for 5 days.)     Allergies:   Codeine and Levofloxacin   Past Medical History:  Diagnosis Date   Allergy    Anxiety    Arthritis    Cancer (HCC)    Phreesia 12/10/2019   Carotid artery disease (HCC)    Coarctation of aorta    a. s/p remote surgery.   Depression    Essential hypertension    Former tobacco use    Hyperlipidemia    Melanoma (HCC) 1990   RIGHT posterior shoulder   Polysubstance abuse (HCC)    a. former alcoholic, drug use ("all of them"). Sober for 11 years as of 2019.   Screening for malignant neoplasm of prostate 10/27/2022   Tubular adenoma of colon 12/2014    Past Surgical History:  Procedure Laterality Date   AORTA SURGERY     congenital narrowing repair age 9 1/67 years old   COLONOSCOPY     with polyps   FOOT  SURGERY Left    MELANOMA EXCISION Right 03/13/1988   RIGHT posterior shoulder     Social History:  The patient  reports that he quit smoking about 7 years ago. His smoking use included cigarettes. He started smoking about 27 years ago. He has a 10 pack-year smoking history. He has quit using smokeless tobacco. He reports that he does not currently use alcohol. He reports that he does not currently use drugs after having used the following drugs: Hydrocodone.   Family History:  The patient's family history includes Cancer in his sister; Diabetes in his father, mother, and sister; Early death in his father; Heart disease in his brother, father, and mother; Hyperlipidemia in his mother and sister; Hypertension in his father.    ROS:  Please see the history of present illness. All other systems are reviewed and  Negative to the above problem except as noted.    PHYSICAL EXAM: VS:  BP 118/64   Pulse (!) 53   Ht 5\' 9"  (1.753 m)   Wt 156 lb 3.2 oz (70.9 kg)   SpO2 96%  BMI 23.07 kg/m   GEN: Pt is in no acute distress  HEENT: normal  Neck: no JVD  L carotid bruit   Cardiac: RRR;  II/VI systolic murmur bse   No LE edema  Respiratory:  clear to auscultation  GI: soft, nontender  No hepatomegaly  MS: no deformity Moving all extremities   Skin: warm and dry, no rash  EKG:  EKG is  ordered today.   SR 61 bpm  Nonspecific ST changes    Carotid USN  Jun 2024  Right Carotid: Velocities in the right ICA are consistent with a 40-59%                 stenosis.   Left Carotid: Velocities in the left ICA are consistent with a 1-39%  stenosis.   Vertebrals: Bilateral vertebral arteries demonstrate antegrade flow.  Subclavians: Normal flow hemodynamics were seen in bilateral subclavian               arteries.  MRI   2/ 2024  1. Similar appearance of repaired aortic coarctation with residual narrowing. The narrowest point measures between 1.4-1.6 cm in the region of the aortic isthmus. 2.  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 Recommend annual imaging followup by CTA or MRA. This recommendation follows 2010 ACCF/AHA/AATS/ACR/ASA/SCA/SCAI/SIR/STS/SVM Guidelines for the Diagnosis and Management of Patients with Thoracic Aortic Disease. Circulation. 2010; 121: O130-Q657. Aortic aneurysm NOS (ICD10-I71.9) 3. Stable dilation of the aortic root with a maximal diameter of 4.7 cm.   months.  Lipid Panel    Component Value Date/Time   CHOL 140 02/22/2022 0927   TRIG 51 02/22/2022 0927   HDL 60 02/22/2022 0927   CHOLHDL 2.3 02/22/2022 0927   CHOLHDL 2.7 12/21/2015 0810   VLDL 11 12/21/2015 0810   LDLCALC 69 02/22/2022 0927      Wt Readings from Last 3 Encounters:  03/05/23 156 lb 3.2 oz (70.9 kg)  12/22/22 155 lb (70.3 kg)  12/05/22 155 lb (70.3 kg)      ASSESSMENT AND PLAN:  1  Aortic pathology   Pt is s/p repair of coarctation of the aorta    MRI in Feb 2024 coarc site is stable   Aortic root stable at 47 mm   Will sched follow up MRI   2  CV dz  CV study above  pt s/p L CAD  Mild to moderate plaquing   Follow in June  3  HTN  BP is well controlled     4  HL   Continue crestor Hold Zetia and see if rash improves   Check lipomed   5  Skin  INtermitt erythematous ash  Hold Zetia and follow  Switch to simple sope  Will check A1C and BMET  F/U in 1 year   Follow up in 1 year    Current medicines are reviewed at length with the patient today.  The patient does not have concerns regarding medicines.  Signed, Dietrich Pates, MD  03/05/2023 11:44 AM    [facility] [address] [place], [place], [place]  [address] Phone: [phone]; Fax: [phone]

## 2023-03-05 ENCOUNTER — Ambulatory Visit: Payer: Medicare Other | Attending: Internal Medicine | Admitting: Internal Medicine

## 2023-03-05 VITALS — BP 118/64 | HR 53 | Ht 69.0 in | Wt 156.2 lb

## 2023-03-05 DIAGNOSIS — I6523 Occlusion and stenosis of bilateral carotid arteries: Secondary | ICD-10-CM

## 2023-03-05 DIAGNOSIS — R7303 Prediabetes: Secondary | ICD-10-CM | POA: Diagnosis not present

## 2023-03-05 NOTE — Patient Instructions (Addendum)
Medication Instructions:  Your physician recommends that you continue on your current medications as directed. Please refer to the Current Medication list given to you today.  *If you need a refill on your cardiac medications before your next appointment, please call your pharmacy*  Lab Work: TODAY: CBC, BMET, Lipomed, Hgb A1c If you have labs (blood work) drawn today and your tests are completely normal, you will receive your results only by: MyChart Message (if you have MyChart) OR A paper copy in the mail If you have any lab test that is abnormal or we need to change your treatment, we will call you to review the results.  Testing/Procedures: Your physician has requested you have a MRI of chest/aorta performed in February 2025 (order in [place]).   Your physician has requested that you have a carotid duplex in June 2025. This test is an ultrasound of the carotid arteries in your neck. It looks at blood flow through these arteries that supply the brain with blood. Allow one hour for this exam. There are no restrictions or special instructions.   Follow-Up: At [facility], you and your health needs are our priority.  As part of our continuing mission to provide you with exceptional heart care, we have created designated Provider Care Teams.  These Care Teams include your primary Cardiologist (physician) and Advanced Practice Providers (APPs -  Physician Assistants and Nurse Practitioners) who all work together to provide you with the care you need, when you need it.  Your next appointment:   1 year(s)  The format for your next appointment:   In Person  Provider:   Dietrich Pates, MD {  Other Instructions    You are scheduled for Cardiac MRI on ______. Please arrive for your appointment at ______( arrive 30-45 minutes prior to test start time). ?   [facility] [address] [place], [place] [address] [phone] Please take advantage of the free valet parking  available at the [facility] and [employer] (Entrance C).  Proceed to the [facility] Radiology Department (First Floor) for check-in.    Magnetic resonance imaging (MRI) is a painless test that produces images of the inside of the body without using Xrays.   During an MRI, strong magnets and radio waves work together in a Data processing manager to form detailed images.    MRI images may provide more details about a medical condition than X-rays, CT scans, and ultrasounds can provide.   You may be given earphones to listen for instructions.   You may eat a light breakfast and take medications as ordered with the exception of furosemide, hydrochlorothiazide, or spironolactone(fluid pill, other). Please avoid stimulants for 12 hr prior to test. (Ie. Caffeine, nicotine, chocolate, or antihistamine medications)   If a contrast material will be used, an IV will be inserted into one of your veins. Contrast material will be injected into your IV. It will leave your body through your urine within a day. You may be told to drink plenty of fluids to help flush the contrast material out of your system.   You will be asked to remove all metal, including: Watch, jewelry, and other metal objects including hearing aids, hair pieces and dentures. Also wearable glucose monitoring systems (ie. Freestyle Libre and Omnipods) (Braces and fillings normally are not a problem.)   TEST WILL TAKE APPROXIMATELY 1 HOUR   PLEASE NOTIFY SCHEDULING AT LEAST 24 HOURS IN ADVANCE IF YOU ARE UNABLE TO KEEP YOUR APPOINTMENT. [phone]  For more information and frequently asked questions, please visit our website : [email or web address]   Please call Rockwell Alexandria, cardiac imaging nurse navigator with any questions/concerns. Rockwell Alexandria RN Navigator Cardiac Imaging Larey Brick RN Navigator Cardiac Imaging [facility] [phone] Office

## 2023-03-06 LAB — CBC
Hematocrit: 40.1 % (ref 37.5–51.0)
Hemoglobin: 13.4 g/dL (ref 13.0–17.7)
MCH: 32.3 pg (ref 26.6–33.0)
MCHC: 33.4 g/dL (ref 31.5–35.7)
MCV: 97 fL (ref 79–97)
Platelets: 257 10*3/uL (ref 150–450)
RBC: 4.15 x10E6/uL (ref 4.14–5.80)
RDW: 12.4 % (ref 11.6–15.4)
WBC: 9.2 10*3/uL (ref 3.4–10.8)

## 2023-03-06 LAB — NMR, LIPOPROFILE
Cholesterol, Total: 130 mg/dL (ref 100–199)
HDL Particle Number: 36 umol/L (ref >=30.5)
HDL-C: 63 mg/dL (ref >39)
LDL Particle Number: 421 nmol/L (ref <1000)
LDL Size: 20.4 nmol — ABNORMAL LOW (ref >20.5)
LDL-C (NIH Calc): 51 mg/dL (ref 0–99)
LP-IR Score: <25 (ref <=45)
Small LDL Particle Number: 157 nmol/L (ref <=527)
Triglycerides: 79 mg/dL (ref 0–149)

## 2023-03-06 LAB — BASIC METABOLIC PANEL
BUN/Creatinine Ratio: 19 (ref 10–24)
BUN: 19 mg/dL (ref 8–27)
CO2: 22 mmol/L (ref 20–29)
Calcium: 9.1 mg/dL (ref 8.6–10.2)
Chloride: 104 mmol/L (ref 96–106)
Creatinine, Ser: 0.98 mg/dL (ref 0.76–1.27)
Glucose: 95 mg/dL (ref 70–99)
Potassium: 4.8 mmol/L (ref 3.5–5.2)
Sodium: 138 mmol/L (ref 134–144)
eGFR: 83 mL/min/{1.73_m2} (ref >59)

## 2023-03-06 LAB — HEMOGLOBIN A1C
Est. average glucose Bld gHb Est-mCnc: 123 mg/dL
Hgb A1c MFr Bld: 5.9 % — ABNORMAL HIGH (ref 4.8–5.6)

## 2023-03-13 ENCOUNTER — Telehealth: Payer: Self-pay

## 2023-03-13 DIAGNOSIS — I6523 Occlusion and stenosis of bilateral carotid arteries: Secondary | ICD-10-CM

## 2023-03-13 DIAGNOSIS — E7849 Other hyperlipidemia: Secondary | ICD-10-CM

## 2023-03-13 DIAGNOSIS — Z79899 Other long term (current) drug therapy: Secondary | ICD-10-CM

## 2023-03-13 NOTE — Telephone Encounter (Signed)
Called and spoke with patient who understands recommendation to repeat lipomed. He is still holding his zetia. Orders placed and released at this time.

## 2023-03-13 NOTE — Telephone Encounter (Signed)
-----   Message from Clifford sent at 03/08/2023  3:23 PM EST ----- CBC is normal  Electrolytes and kidney function are OK Hgb A1C (sugar control over 3 months) is a little high   Watch/limit carbs, sugars Lipids overall are excellent  Pt was told to hold Zetia    See if rash improves   Check lipomed in 5 months

## 2023-04-28 ENCOUNTER — Other Ambulatory Visit: Payer: Self-pay | Admitting: Internal Medicine

## 2023-04-28 DIAGNOSIS — E7849 Other hyperlipidemia: Secondary | ICD-10-CM

## 2023-05-07 ENCOUNTER — Ambulatory Visit (HOSPITAL_COMMUNITY)
Admission: RE | Admit: 2023-05-07 | Discharge: 2023-05-07 | Disposition: A | Discharge status: Home / Self Care | Payer: Medicare Other | Source: Ambulatory Visit | Attending: Physician Assistant | Admitting: Physician Assistant

## 2023-05-07 DIAGNOSIS — I7121 Aneurysm of the ascending aorta, without rupture: Secondary | ICD-10-CM | POA: Diagnosis not present

## 2023-05-07 DIAGNOSIS — I77810 Thoracic aortic ectasia: Secondary | ICD-10-CM | POA: Insufficient documentation

## 2023-05-07 DIAGNOSIS — Q251 Coarctation of aorta: Secondary | ICD-10-CM | POA: Insufficient documentation

## 2023-05-07 MED ORDER — GADOBUTROL 1 MMOL/ML IV SOLN
7.0000 mL | Freq: Once | INTRAVENOUS | Status: AC | PRN
  Administered 2023-05-07: 7 mL via INTRAVENOUS

## 2023-05-14 ENCOUNTER — Encounter: Payer: Self-pay | Admitting: Internal Medicine

## 2023-07-31 DIAGNOSIS — I6523 Occlusion and stenosis of bilateral carotid arteries: Secondary | ICD-10-CM | POA: Diagnosis not present

## 2023-07-31 DIAGNOSIS — E7849 Other hyperlipidemia: Secondary | ICD-10-CM | POA: Diagnosis not present

## 2023-07-31 DIAGNOSIS — Z79899 Other long term (current) drug therapy: Secondary | ICD-10-CM | POA: Diagnosis not present

## 2023-08-02 LAB — NMR, LIPOPROFILE
Cholesterol, Total: 162 mg/dL (ref 100–199)
HDL Particle Number: 37.7 umol/L (ref >=30.5)
HDL-C: 62 mg/dL (ref >39)
LDL Particle Number: 912 nmol/L (ref <1000)
LDL Size: 21.4 nm (ref >20.5)
LDL-C (NIH Calc): 88 mg/dL (ref 0–99)
LP-IR Score: 30 (ref <=45)
Small LDL Particle Number: 384 nmol/L (ref <=527)
Triglycerides: 57 mg/dL (ref 0–149)

## 2023-08-07 ENCOUNTER — Ambulatory Visit: Payer: Self-pay | Admitting: Internal Medicine

## 2023-08-07 DIAGNOSIS — E7849 Other hyperlipidemia: Secondary | ICD-10-CM

## 2023-08-09 MED ORDER — EZETIMIBE 10 MG PO TABS: 10.0000 mg | ORAL_TABLET | Freq: Every day | ORAL | 3 refills | Status: AC

## 2023-09-03 ENCOUNTER — Ambulatory Visit (HOSPITAL_COMMUNITY)
Admission: RE | Admit: 2023-09-03 | Discharge: 2023-09-03 | Disposition: A | Discharge status: Home / Self Care | Payer: Medicare Other | Source: Ambulatory Visit | Attending: Internal Medicine | Admitting: Internal Medicine

## 2023-09-03 DIAGNOSIS — I6523 Occlusion and stenosis of bilateral carotid arteries: Secondary | ICD-10-CM | POA: Diagnosis not present

## 2023-09-05 ENCOUNTER — Ambulatory Visit: Payer: Self-pay | Admitting: Internal Medicine

## 2023-10-22 ENCOUNTER — Ambulatory Visit: Admitting: Podiatry

## 2023-10-22 ENCOUNTER — Encounter: Payer: Self-pay | Admitting: Podiatry

## 2023-10-22 DIAGNOSIS — M7752 Other enthesopathy of left foot: Secondary | ICD-10-CM | POA: Diagnosis not present

## 2023-10-22 DIAGNOSIS — M779 Enthesopathy, unspecified: Secondary | ICD-10-CM

## 2023-10-22 DIAGNOSIS — M7751 Other enthesopathy of right foot: Secondary | ICD-10-CM | POA: Diagnosis not present

## 2023-10-22 NOTE — Progress Notes (Signed)
 Subjective:   Patient ID: Gabriel Pittman, male   DOB: 68 y.o.   MRN: [id number]   HPI Patient states he is here for new orthotics and he does get periodic pain in his feet   ROS      Objective:  Physical Exam  Neurovascular status intact with low-grade discomfort of his feet that orthotics have been very beneficial but his last pair were ruined by water immersion     Assessment:  Chronic tendinitis helped significantly with orthotic therapy     Plan:  H&P reviewed with the patient and at this time I went ahead and I had pedorthist evaluate patient and orthotics are made for the patient.  Patient will be seen back when orthotics returned

## 2023-10-29 DIAGNOSIS — Z08 Encounter for follow-up examination after completed treatment for malignant neoplasm: Secondary | ICD-10-CM | POA: Diagnosis not present

## 2023-10-29 DIAGNOSIS — L570 Actinic keratosis: Secondary | ICD-10-CM | POA: Diagnosis not present

## 2023-10-29 DIAGNOSIS — Z85820 Personal history of malignant melanoma of skin: Secondary | ICD-10-CM | POA: Diagnosis not present

## 2023-10-29 DIAGNOSIS — L814 Other melanin hyperpigmentation: Secondary | ICD-10-CM | POA: Diagnosis not present

## 2023-10-29 DIAGNOSIS — D225 Melanocytic nevi of trunk: Secondary | ICD-10-CM | POA: Diagnosis not present

## 2023-10-29 DIAGNOSIS — L821 Other seborrheic keratosis: Secondary | ICD-10-CM | POA: Diagnosis not present

## 2023-11-20 ENCOUNTER — Telehealth: Payer: Self-pay | Admitting: *Deleted

## 2023-11-20 NOTE — Telephone Encounter (Signed)
 I was in there 3-4 weeks ago.  I ordered some orthotics.  I haven't heard anything back.  I was wondering the status of that.

## 2023-11-29 ENCOUNTER — Ambulatory Visit

## 2023-11-29 NOTE — Progress Notes (Signed)
 Patient presents today to pick up custom molded foot orthotics, diagnosed with Tendonitis by Dr. GEANNIE Ovens.   Orthotics were dispensed and fit was satisfactory. Reviewed instructions for break-in and wear. Written instructions given to patient.  Patient will follow up as needed.   Lolita Schultze Cped< CFo, CFm

## 2023-12-07 ENCOUNTER — Ambulatory Visit (INDEPENDENT_AMBULATORY_CARE_PROVIDER_SITE_OTHER)

## 2023-12-07 ENCOUNTER — Encounter: Payer: Self-pay | Admitting: Podiatry

## 2023-12-07 ENCOUNTER — Ambulatory Visit: Admitting: Podiatry

## 2023-12-07 DIAGNOSIS — G5762 Lesion of plantar nerve, left lower limb: Secondary | ICD-10-CM

## 2023-12-07 MED ORDER — TRIAMCINOLONE ACETONIDE 10 MG/ML IJ SUSP
10.0000 mg | Freq: Once | INTRAMUSCULAR | Status: AC
  Administered 2023-12-07: 10 mg via INTRA_ARTICULAR

## 2023-12-07 NOTE — Progress Notes (Signed)
 Subjective:   Patient ID: Gabriel Pittman, male   DOB: 68 y.o.   MRN: [id number]   HPI Patient presents stating that he all of a sudden starts develop severe pain between his 3rd and 4th toes on his left foot about 3 weeks ago.  Does not remember specific injury but he has been more active   ROS      Objective:  Physical Exam  Neurovascular status intact exquisite discomfort third interspace left radiating discomfort and pain localized     Assessment:  Neuroma symptomatology third interspace left foot or possible inflammatory process with history of neuroma excision but does not appear to be reoccurrence of nodular with negative Margaree sign     Plan:  Reviewed at great length x-ray taken reviewed today I did sterile prep I injected the third interspace into any nerve tissue 3 mg dexamethasone Kenalog  5 mg Xylocaine and I want to check back again.  May require more aggressive treatment plan depending on response  X-rays indicate that there is no signs of bony arthritis or fracture associated with condition

## 2023-12-12 DIAGNOSIS — M542 Cervicalgia: Secondary | ICD-10-CM | POA: Diagnosis not present

## 2023-12-19 DIAGNOSIS — M47812 Spondylosis without myelopathy or radiculopathy, cervical region: Secondary | ICD-10-CM | POA: Diagnosis not present

## 2023-12-21 ENCOUNTER — Ambulatory Visit: Admitting: Podiatry

## 2023-12-27 DIAGNOSIS — M47812 Spondylosis without myelopathy or radiculopathy, cervical region: Secondary | ICD-10-CM | POA: Diagnosis not present

## 2023-12-28 ENCOUNTER — Ambulatory Visit: Payer: Medicare Other

## 2023-12-28 VITALS — BP 133/68 | Ht 69.0 in | Wt 155.0 lb

## 2023-12-28 DIAGNOSIS — Z0000 Encounter for general adult medical examination without abnormal findings: Secondary | ICD-10-CM | POA: Diagnosis not present

## 2023-12-28 NOTE — Progress Notes (Signed)
 Subjective:   Gabriel Pittman is a 68 y.o. who presents for a Medicare Wellness preventive visit.  As a reminder, Annual Wellness Visits don't include a physical exam, and some assessments may be limited, especially if this visit is performed virtually. We may recommend an in-person follow-up visit with your provider if needed.  Visit Complete: Virtual I connected with  Gabriel Pittman on 12/28/23 by a video and audio enabled telemedicine application and verified that I am speaking with the correct person using two identifiers.  Patient Location: Home  Provider Location: Office/Clinic  I discussed the limitations of evaluation and management by telemedicine. The patient expressed understanding and agreed to proceed.  Vital Signs: Because this visit was a virtual/telehealth visit, some criteria may be missing or patient reported. Any vitals not documented were not able to be obtained and vitals that have been documented are patient reported.    Persons Participating in Visit: Patient.  AWV Questionnaire: Yes: Patient Medicare AWV questionnaire was completed by the patient on 12/24/2023; I have confirmed that all information answered by patient is correct and no changes since this date.  Cardiac Risk Factors include: advanced age (>73men, >70 women);dyslipidemia;hypertension;male gender     Objective:    Today's Vitals   12/28/23 1333 12/28/23 1334  BP: 133/68   Weight: 155 lb (70.3 kg)   Height: 5' 9 (1.753 m)   PainSc:  3    Body mass index is 22.89 kg/m.     12/28/2023    1:39 PM 12/25/2022    2:16 PM 12/12/2019    8:57 AM 12/21/2014    9:06 AM  Advanced Directives  Does Patient Have a Medical Advance Directive? No No No No   Would patient like information on creating a medical advance directive? No - Patient declined        Data saved with a previous flowsheet row definition    Current Medications (verified) Outpatient Encounter Medications as of 12/28/2023   Medication Sig   ezetimibe  (ZETIA ) 10 MG tablet Take 1 tablet (10 mg total) by mouth daily.   lisinopril  (ZESTRIL ) 10 MG tablet TAKE 1 TABLET BY MOUTH DAILY (Patient taking differently: Takes 5 mg QD)   Multiple Vitamin (MULTIVITAMIN) capsule Take 1 capsule by mouth daily.   rosuvastatin  (CRESTOR ) 20 MG tablet Take 1 tablet (20 mg total) by mouth daily.   No facility-administered encounter medications on file as of 12/28/2023.    Allergies (verified) Codeine and Levofloxacin   History: Past Medical History:  Diagnosis Date   Allergy    Anxiety    Arthritis    Cancer (HCC)    [employer] 12/10/2019   Carotid artery disease    Coarctation of aorta    a. s/p remote surgery.   Depression    Essential hypertension    Former tobacco use    Hyperlipidemia    Melanoma (HCC) 1990   RIGHT posterior shoulder   Polysubstance abuse (HCC)    a. former alcoholic, drug use (all of them). Sober for 11 years as of 2019.   Screening for malignant neoplasm of prostate 10/27/2022   Tubular adenoma of colon 12/2014   Past Surgical History:  Procedure Laterality Date   AORTA SURGERY     congenital narrowing repair age 53 1/68 years old   COLONOSCOPY     with polyps   FOOT SURGERY Left    MELANOMA EXCISION Right 03/13/1988   RIGHT posterior shoulder   Family History  Problem Relation Age  of Onset   Heart disease Mother    Diabetes Mother    Hyperlipidemia Mother    Heart disease Father    Diabetes Father    Hypertension Father    Early death Father    Cancer Sister        per patient of the bone   Hyperlipidemia Sister    Diabetes Sister    Heart disease Brother    Colon cancer Neg Hx    Colon polyps Neg Hx    Esophageal cancer Neg Hx    Stomach cancer Neg Hx    Rectal cancer Neg Hx    Social History   Socioeconomic History   Marital status: Married    Spouse name: Gabriel Pittman   Number of children: 0   Years of education: Not on file   Highest education level: Some college, no  degree  Occupational History   Occupation: retired  Tobacco Use   Smoking status: Former    Current packs/day: 0.00    Average packs/day: 0.5 packs/day for 20.0 years (10.0 ttl pk-yrs)    Types: Cigarettes    Start date: 03/14/1995    Quit date: 03/14/2015    Years since quitting: 8.7   Smokeless tobacco: Former  Building services engineer status: Former  Substance and Sexual Activity   Alcohol use: Not Currently    Comment: 2007   Drug use: Not Currently    Types: Hydrocodone     Comment: 2007   Sexual activity: Yes    Birth control/protection: None  Other Topics Concern   Not on file  Social History Narrative   Lives with his wife.  She has 2 children from a previous relationship.   Exercise at gym cardio and strength training 3-4 times/week   Social Drivers of Health   Financial Resource Strain: Low Risk  (12/28/2023)   Overall Financial Resource Strain (CARDIA)    Difficulty of Paying Living Expenses: Not hard at all  Food Insecurity: No Food Insecurity (12/28/2023)   Hunger Vital Sign    Worried About Running Out of Food in the Last Year: Never true    Ran Out of Food in the Last Year: Never true  Transportation Needs: No Transportation Needs (12/28/2023)   PRAPARE - Administrator, Civil Service (Medical): No    Lack of Transportation (Non-Medical): No  Physical Activity: Sufficiently Active (12/28/2023)   Exercise Vital Sign    Days of Exercise per Week: 3 days    Minutes of Exercise per Session: 90 min  Stress: No Stress Concern Present (12/28/2023)   [employer] of Occupational Health - Occupational Stress Questionnaire    Feeling of Stress: Not at all  Social Connections: Unknown (12/28/2023)   Social Connection and Isolation Panel    Frequency of Communication with Friends and Family: Once a week    Frequency of Social Gatherings with Friends and Family: Not on file    Attends Religious Services: Never    Database administrator or Organizations:  Yes    Attends Engineer, structural: More than 4 times per year    Marital Status: Married    Tobacco Counseling Counseling given: Not Answered    Clinical Intake:  Pre-visit preparation completed: Yes  Pain : 0-10 Pain Score: 3  Pain Type: Acute pain Pain Location: Neck Pain Descriptors / Indicators: Aching Pain Onset: 1 to 4 weeks ago Pain Frequency: Constant     Nutritional Status: BMI of 19-24  Normal  Nutritional Risks: None Diabetes: No  Lab Results  Component Value Date   HGBA1C 5.9 (H) 03/05/2023     How often do you need to have someone help you when you read instructions, pamphlets, or other written materials from your doctor or pharmacy?: 1 - Never  Interpreter Needed?: No  Information entered by :: NAllen LPN   Activities of Daily Living     12/24/2023    8:53 AM  In your present state of health, do you have any difficulty performing the following activities:  Hearing? 0  Vision? 0  Difficulty concentrating or making decisions? 0  Walking or climbing stairs? 0  Dressing or bathing? 0  Doing errands, shopping? 0  Preparing Food and eating ? N  Using the Toilet? N  In the past six months, have you accidently leaked urine? N  Do you have problems with loss of bowel control? N  Managing your Medications? N  Managing your Finances? N  Housekeeping or managing your Housekeeping? N    Patient Care Team: Sebastian Beverley NOVAK, MD as PCP - General (Family Medicine) Okey Vina GAILS, MD as PCP - Cardiology (Cardiology)  I have updated your Care Teams any recent Medical Services you may have received from other providers in the past year.     Assessment:   This is a routine wellness examination for Larin.  Hearing/Vision screen Hearing Screening - Comments:: Denies hearing issues Vision Screening - Comments:: Regular eye exams, [facility]   Goals Addressed             This Visit's Progress    Patient Stated       12/28/2023, get neck  fixed       Depression Screen     12/28/2023    1:39 PM 12/25/2022    2:17 PM 10/27/2022    1:09 PM 12/12/2019    8:50 AM 12/31/2018   10:13 AM 10/31/2018    2:06 PM 08/03/2017    9:01 AM  PHQ 2/9 Scores  PHQ - 2 Score 0 0 1 0 0 0 0  PHQ- 9 Score 2 0 2   0     Fall Risk     12/24/2023    8:53 AM 12/21/2022    8:56 AM 10/27/2022    1:09 PM 12/12/2019    8:50 AM 12/31/2018   10:13 AM  Fall Risk   Falls in the past year? 0 0 0 0 1   Number falls in past yr: 0 0 0 0 0   Injury with Fall? 0 0 0 0 0  Risk for fall due to : Medication side effect Medication side effect     Follow up Falls prevention discussed;Falls evaluation completed Falls prevention discussed;Falls evaluation completed  Falls evaluation completed       Data saved with a previous flowsheet row definition    MEDICARE RISK AT HOME:  Medicare Risk at Home Any stairs in or around the home?: (Patient-Rptd) No Home free of loose throw rugs in walkways, pet beds, electrical cords, etc?: (Patient-Rptd) Yes Adequate lighting in your home to reduce risk of falls?: (Patient-Rptd) Yes Life alert?: (Patient-Rptd) No Use of a cane, walker or w/c?: (Patient-Rptd) No Grab bars in the bathroom?: (Patient-Rptd) No Shower chair or bench in shower?: (Patient-Rptd) No Elevated toilet seat or a handicapped toilet?: (Patient-Rptd) No  TIMED UP AND GO:  Was the test performed?  No  Cognitive Function: 6CIT completed        12/28/2023  1:40 PM 12/25/2022    2:17 PM 12/12/2019    8:53 AM  6CIT Screen  What Year? 0 points 0 points 0 points  What month? 0 points 0 points 0 points  What time? 0 points 0 points 0 points  Count back from 20 0 points 0 points 0 points  Months in reverse 0 points 0 points 0 points  Repeat phrase 0 points 2 points 0 points  Total Score 0 points 2 points 0 points    Immunizations Immunization History  Administered Date(s) Administered   Fluad Quad(high Dose 65+) 12/12/2019, 11/28/2022,  11/25/2023   Influenza Inj Mdck Quad Pf 11/22/2017   Influenza Split 11/12/2014, 11/11/2016, 11/22/2017   Influenza,inj,Quad PF,6+ Mos 11/15/2016, 11/21/2018   Influenza-Unspecified 11/11/2020, 11/24/2021   PFIZER Comirnaty(Gray Top)Covid-19 Tri-Sucrose Vaccine 06/12/2020   PFIZER(Purple Top)SARS-COV-2 Vaccination 04/25/2019, 05/18/2019   Pfizer(Comirnaty)Fall Seasonal Vaccine 12 years and older 11/25/2023   Pneumococcal Polysaccharide-23 12/12/2019   Tdap 12/16/2014   Unspecified SARS-COV-2 Vaccination 11/28/2022   Zoster Recombinant(Shingrix) 01/23/2023, 04/16/2023   Zoster, Live 12/17/2014    Screening Tests Health Maintenance  Topic Date Due   Pneumococcal Vaccine: 50+ Years (2 of 2 - PCV) 12/11/2020   COVID-19 Vaccine (6 - Pfizer risk 2025-26 season) 05/24/2024   DTaP/Tdap/Td (2 - Td or Tdap) 12/15/2024   Medicare Annual Wellness (AWV)  12/27/2024   Colonoscopy  12/21/2025   Influenza Vaccine  Completed   Hepatitis C Screening  Completed   Zoster Vaccines- Shingrix  Completed   Meningococcal B Vaccine  Aged Out    Health Maintenance Items Addressed: Vaccines Due: pneumonia  Additional Screening:  Vision Screening: Recommended annual ophthalmology exams for early detection of glaucoma and other disorders of the eye. Is the patient up to date with their annual eye exam?  Yes  Who is the provider or what is the name of the office in which the patient attends annual eye exams? [facility]  Dental Screening: Recommended annual dental exams for proper oral hygiene  Community Resource Referral / Chronic Care Management: CRR required this visit?  No   CCM required this visit?  No   Plan:    I have personally reviewed and noted the following in the patient's chart:   Medical and social history Use of alcohol, tobacco or illicit drugs  Current medications and supplements including opioid prescriptions. Patient is not currently taking opioid prescriptions. Functional  ability and status Nutritional status Physical activity Advanced directives List of other physicians Hospitalizations, surgeries, and ER visits in previous 12 months Vitals Screenings to include cognitive, depression, and falls Referrals and appointments  In addition, I have reviewed and discussed with patient certain preventive protocols, quality metrics, and best practice recommendations. A written personalized care plan for preventive services as well as general preventive health recommendations were provided to patient.   Ardella FORBES Dawn, LPN   [id number]/[id number]   After Visit Summary: (MyChart) Due to this being a telephonic visit, the after visit summary with patients personalized plan was offered to patient via MyChart   Notes: Nothing significant to report at this time.

## 2023-12-28 NOTE — Patient Instructions (Signed)
 Gabriel Pittman,  Thank you for taking the time for your Medicare Wellness Visit. I appreciate your continued commitment to your health goals. Please review the care plan we discussed, and feel free to reach out if I can assist you further.  Medicare recommends these wellness visits once per year to help you and your care team stay ahead of potential health issues. These visits are designed to focus on prevention, allowing your provider to concentrate on managing your acute and chronic conditions during your regular appointments.  Please note that Annual Wellness Visits do not include a physical exam. Some assessments may be limited, especially if the visit was conducted virtually. If needed, we may recommend a separate in-person follow-up with your provider.  Ongoing Care Seeing your primary care provider every 3 to 6 months helps us  monitor your health and provide consistent, personalized care.   Referrals If a referral was made during today's visit and you haven't received any updates within two weeks, please contact the referred provider directly to check on the status.  Recommended Screenings:  Health Maintenance  Topic Date Due   Pneumococcal Vaccine for age over 74 (2 of 2 - PCV) 12/11/2020   COVID-19 Vaccine (6 - Pfizer risk 2025-26 season) 05/24/2024   DTaP/Tdap/Td vaccine (2 - Td or Tdap) 12/15/2024   Medicare Annual Wellness Visit  12/27/2024   Colon Cancer Screening  12/21/2025   Flu Shot  Completed   Hepatitis C Screening  Completed   Zoster (Shingles) Vaccine  Completed   Meningitis B Vaccine  Aged Out       12/28/2023    1:39 PM  Advanced Directives  Does Patient Have a Medical Advance Directive? No  Would patient like information on creating a medical advance directive? No - Patient declined   Advance Care Planning is important because it: Ensures you receive medical care that aligns with your values, goals, and preferences. Provides guidance to your family and loved  ones, reducing the emotional burden of decision-making during critical moments.  Vision: Annual vision screenings are recommended for early detection of glaucoma, cataracts, and diabetic retinopathy. These exams can also reveal signs of chronic conditions such as diabetes and high blood pressure.  Dental: Annual dental screenings help detect early signs of oral cancer, gum disease, and other conditions linked to overall health, including heart disease and diabetes.  Please see the attached documents for additional preventive care recommendations.

## 2024-01-25 ENCOUNTER — Telehealth: Payer: Self-pay

## 2024-01-25 NOTE — Telephone Encounter (Signed)
 Called patient to get an appt made with PCP. Patient has declined to schedule at this time. He states that he will call at a later time to schedule.

## 2024-02-02 ENCOUNTER — Ambulatory Visit
Admission: RE | Admit: 2024-02-02 | Discharge: 2024-02-02 | Disposition: A | Discharge status: Home / Self Care | Source: Ambulatory Visit | Attending: Emergency Medicine

## 2024-02-02 VITALS — BP 132/64 | HR 73 | Temp 98.1°F | Ht 69.0 in | Wt 155.0 lb

## 2024-02-02 DIAGNOSIS — M542 Cervicalgia: Secondary | ICD-10-CM | POA: Diagnosis not present

## 2024-02-02 DIAGNOSIS — G8929 Other chronic pain: Secondary | ICD-10-CM

## 2024-02-02 MED ORDER — BACLOFEN 5 MG PO TABS: 5.0000 mg | ORAL_TABLET | Freq: Every evening | ORAL | 0 refills | Status: DC | PRN

## 2024-02-02 MED ORDER — PREDNISONE 10 MG (21) PO TBPK: ORAL_TABLET | Freq: Every day | ORAL | 0 refills | Status: DC

## 2024-02-02 NOTE — ED Provider Notes (Signed)
 [facility]    CSN: [id number] Arrival date & time: 02/02/24  1106      History   Chief Complaint Chief Complaint  Patient presents with   Neck Injury    Entered by patient    HPI Gabriel Pittman is a 68 y.o. male.   Chronic neck pain followed by ortho Last saw them 12 days ago but wasn't given any new meds Increasing pain recently Currently 7/10  He had steroid taper in early October that seemed to be the only helpful treatment  Had neck injection October 8th that helped minimally   He tried 800 mg ibuprofen this morning without help  Denies headache, dizziness, vision changes, recent head or neck injury, back pain, paresthesias, weakness, fever, neck stiffness No chest pain or tightness, shortness of breath.  Denies jaw or temple pain  Past Medical History:  Diagnosis Date   Allergy    Anxiety    Arthritis    Cancer (HCC)    Phreesia 12/10/2019   Carotid artery disease    Coarctation of aorta    a. s/p remote surgery.   Depression    Essential hypertension    Former tobacco use    Hyperlipidemia    Melanoma (HCC) 1990   RIGHT posterior shoulder   Polysubstance abuse (HCC)    a. former alcoholic, drug use (all of them). Sober for 11 years as of 2019.   Screening for malignant neoplasm of prostate 10/27/2022   Tubular adenoma of colon 12/2014    Patient Active Problem List   Diagnosis Date Noted   Genital herpes simplex 10/27/2022   Screening for malignant neoplasm of prostate 10/27/2022   Eczema 10/27/2022   Polyp of colon 10/27/2022   Essential (primary) hypertension 08/06/2017   Hyperlipidemia LDL goal <70 08/06/2017   Aortic root dilation 08/06/2017   Coarctation of aorta 07/13/2017   Bilateral carotid artery stenosis 06/17/2015   History of melanoma excision 11/10/2013   S/P surgery for complex congenital heart disease 07/01/2011    Past Surgical History:  Procedure Laterality Date   AORTA SURGERY     congenital narrowing  repair age 6 1/68 years old   COLONOSCOPY     with polyps   FOOT SURGERY Left    MELANOMA EXCISION Right 03/13/1988   RIGHT posterior shoulder       Home Medications    Prior to Admission medications   Medication Sig Start Date End Date Taking? Authorizing Provider  Baclofen  5 MG TABS Take 1 tablet (5 mg total) by mouth at bedtime as needed. 02/02/24  Yes Ruhan Borak, [place], PA-C  predniSONE  (STERAPRED UNI-PAK 21 TAB) 10 MG (21) TBPK tablet Take by mouth daily. Please take as directed 02/02/24  Yes Chinmay Squier, [place], PA-C  ezetimibe  (ZETIA ) 10 MG tablet Take 1 tablet (10 mg total) by mouth daily. 08/09/23   Okey Vina GAILS, MD  lisinopril  (ZESTRIL ) 10 MG tablet TAKE 1 TABLET BY MOUTH DAILY Patient taking differently: Takes 5 mg QD 11/17/22   Ross, Paula V, MD  Multiple Vitamin (MULTIVITAMIN) capsule Take 1 capsule by mouth daily.    [provider]  rosuvastatin  (CRESTOR ) 20 MG tablet Take 1 tablet (20 mg total) by mouth daily. 04/30/23   Okey Vina GAILS, MD    Family History Family History  Problem Relation Age of Onset   Heart disease Mother    Diabetes Mother    Hyperlipidemia Mother    Heart disease Father    Diabetes Father  Hypertension Father    Early death Father    Cancer Sister        per patient of the bone   Hyperlipidemia Sister    Diabetes Sister    Heart disease Brother    Colon cancer Neg Hx    Colon polyps Neg Hx    Esophageal cancer Neg Hx    Stomach cancer Neg Hx    Rectal cancer Neg Hx     Social History Social History   Tobacco Use   Smoking status: Former    Current packs/day: 0.00    Average packs/day: 0.5 packs/day for 20.0 years (10.0 ttl pk-yrs)    Types: Cigarettes    Start date: 03/14/1995    Quit date: 03/14/2015    Years since quitting: 8.8   Smokeless tobacco: Former  [employer]   Vaping status: Former  Substance Use Topics   Alcohol use: Not Currently    Comment: 2007   Drug use: Not Currently    Types: Hydrocodone     Comment:  2007     Allergies   Codeine and Levofloxacin   Review of Systems Review of Systems  Per HPI Physical Exam Triage Vital Signs ED Triage Vitals  Encounter Vitals Group     BP 02/02/24 1115 132/64     Girls Systolic BP Percentile --      Girls Diastolic BP Percentile --      Boys Systolic BP Percentile --      Boys Diastolic BP Percentile --      Pulse Rate 02/02/24 1115 73     Resp --      Temp 02/02/24 1115 98.1 F (36.7 C)     Temp Source 02/02/24 1115 Oral     SpO2 02/02/24 1115 98 %     Weight 02/02/24 1115 155 lb (70.3 kg)     Height 02/02/24 1115 5' 9 (1.753 m)     Head Circumference --      Peak Flow --      Pain Score 02/02/24 1146 7     Pain Loc --      Pain Education --      Exclude from Growth Chart --    No data found.  Updated Vital Signs BP 132/64 (BP Location: Right Arm)   Pulse 73   Temp 98.1 F (36.7 C) (Oral)   Ht 5' 9 (1.753 m)   Wt 155 lb (70.3 kg)   SpO2 98%   BMI 22.89 kg/m   Visual Acuity Right Eye Distance:   Left Eye Distance:   Bilateral Distance:    Right Eye Near:   Left Eye Near:    Bilateral Near:     Physical Exam Vitals and nursing note reviewed.  Constitutional:      General: He is not in acute distress. HENT:     Mouth/Throat:     Pharynx: Oropharynx is clear.  Eyes:     Extraocular Movements: Extraocular movements intact.     Conjunctiva/sclera: Conjunctivae normal.     Pupils: Pupils are equal, round, and reactive to light.  Neck:     Vascular: No carotid bruit or JVD.     Trachea: Trachea and phonation normal.      Comments: Full range of motion without pain.  Tenderness to palpation area indicated. Cardiovascular:     Rate and Rhythm: Normal rate and regular rhythm.     Pulses: Normal pulses.     Heart sounds: Normal heart sounds.  Pulmonary:     Effort: Pulmonary effort is normal.     Breath sounds: Normal breath sounds.  Musculoskeletal:     Cervical back: Normal range of motion. No edema or  rigidity. Muscular tenderness present. No pain with movement or spinous process tenderness. Normal range of motion.  Lymphadenopathy:     Cervical: No cervical adenopathy.  Skin:    General: Skin is warm and dry.     Capillary Refill: Capillary refill takes less than 2 seconds.     Findings: No bruising, erythema, lesion or rash.  Neurological:     Mental Status: He is alert and oriented to person, place, and time.     Comments: Strength and sensation equal, intact     UC Treatments / Results  Labs (all labs ordered are listed, but only abnormal results are displayed) Labs Reviewed - No data to display  EKG   Radiology No results found.  Procedures Procedures (including critical care time)  Medications Ordered in UC Medications - No data to display  Initial Impression / Assessment and Plan / UC Course  I have reviewed the triage vital signs and the nursing notes.  Pertinent labs & imaging results that were available during my care of the patient were reviewed by me and considered in my medical decision making (see chart for details).  Stable vitals, well-appearing, neurologically intact No red flags Acute on chronic neck pain Reports steroids have been the most beneficial.  Prednisone  taper is prescribed.  He understands no NSAIDs while using.  Try baclofen  at nighttime, drowsy precautions.  Follow-up with his Ortho/spine specialist.  We have discussed strict ED precautions and he understands reasons to be re-evaluated.  Final Clinical Impressions(s) / UC Diagnoses   Final diagnoses:  Chronic neck pain     Discharge Instructions      Prednisone  taper: 6 pills on day 1 (today) 5 pills on day 2 4 pills on day 3 3 pills on day 4 2 pills on day 5 1 pill on day 6  Try baclofen  (muscle relaxer) at bedtime  Please go to the emergency department if symptoms worsen.    ED Prescriptions     Medication Sig Dispense Auth. Provider   predniSONE  (STERAPRED UNI-PAK  21 TAB) 10 MG (21) TBPK tablet Take by mouth daily. Please take as directed 21 tablet Mustapha Colson, PA-C   Baclofen  5 MG TABS Take 1 tablet (5 mg total) by mouth at bedtime as needed. 20 tablet Raylea Adcox, [place], PA-C      PDMP not reviewed this encounter.   Amayah Staheli, PA-C 02/02/24 1257

## 2024-02-02 NOTE — ED Triage Notes (Addendum)
 Pt c/o neck pain that has been going on for several months. Presents due to flare up in pain. Pt does have bone spurs in neck

## 2024-02-02 NOTE — Discharge Instructions (Signed)
 Prednisone  taper: 6 pills on day 1 (today) 5 pills on day 2 4 pills on day 3 3 pills on day 4 2 pills on day 5 1 pill on day 6  Try baclofen  (muscle relaxer) at bedtime  Please go to the emergency department if symptoms worsen.

## 2024-03-24 ENCOUNTER — Telehealth: Payer: Self-pay | Admitting: Family Medicine

## 2024-03-24 DIAGNOSIS — Q251 Coarctation of aorta: Secondary | ICD-10-CM

## 2024-03-24 NOTE — Telephone Encounter (Signed)
 Copied from CRM #[id number]. Topic: Referral - Request for Referral >> Mar 24, 2024 12:31 PM Nessti S wrote: Did the patient discuss referral with their provider in the last year? Yes (If No - schedule appointment) (If Yes - send message)  Appointment offered? Yes. 1/16 with paula ross  Type of order/referral and detailed reason for visit: cardiologist  Preference of office, provider, location: paula ross; [facility]  If referral order, have you been seen by this specialty before? Yes (If Yes, this issue or another issue? When? Where?  Can we respond through MyChart? Yes

## 2024-03-25 NOTE — Addendum Note (Signed)
 Addended by: SEBASTIAN CROCK B on: 03/25/2024 11:19 AM   Modules accepted: Orders

## 2024-03-27 NOTE — Progress Notes (Addendum)
 "   Cardiology Office Note   Date:   03/29/2023  ID:  Gabriel Pittman, DOB [birth year], MRN [id number]  PCP:  Sebastian Beverley NOVAK, MD  Cardiologist:   Vina Gull, MD    F/U of HTN and coarctation of aorta      History of Present Illness: Gabriel Pittman is a 69 y.o. male with a history of coarctation of the aorta (s/p remote repair), HTN, MVP  CV dz, polycustance abuse    Jan 2024  Echo LVEF 55 to 60%   Moderate AI Aorti root 42 mm   Asc aorta 44 mm   Descending aorta Peak gradient 25 mm Hg  Feb 2025 MRI   Narrowest portion of aorta 1.6 cm   Asc aorta 42 mm (motion artifact prohibited root measurement) The pt says he is doing good from a cardiac standpoint  Denies CP   Breathing is OK  No dizziness No palpitations      Current Meds  Medication Sig   amoxicillin (AMOXIL) 500 MG capsule Take by mouth. Prior to dental work   augmented betamethasone  dipropionate (DIPROLENE -AF) 0.05 % cream Apply topically as needed.   Baclofen  5 MG TABS Take 1 tablet (5 mg total) by mouth at bedtime as needed.   ezetimibe  (ZETIA ) 10 MG tablet Take 1 tablet (10 mg total) by mouth daily.   lisinopril  (ZESTRIL ) 10 MG tablet TAKE 1 TABLET BY MOUTH DAILY (Patient taking differently: Takes 5 mg QD)   Multiple Vitamin (MULTIVITAMIN) capsule Take 1 capsule by mouth daily.   rosuvastatin  (CRESTOR ) 20 MG tablet Take 1 tablet (20 mg total) by mouth daily.     Allergies:   Codeine and Levofloxacin   Past Medical History:  Diagnosis Date   Allergy    Anxiety    Arthritis    Cancer (HCC)    Phreesia 12/10/2019   Carotid artery disease    Coarctation of aorta    a. s/p remote surgery.   Depression    Essential hypertension    Former tobacco use    Hyperlipidemia    Melanoma (HCC) 1990   RIGHT posterior shoulder   Polysubstance abuse (HCC)    a. former alcoholic, drug use (all of them). Sober for 11 years as of 2019.   Screening for malignant neoplasm of prostate 10/27/2022   Tubular adenoma of colon  12/2014    Past Surgical History:  Procedure Laterality Date   AORTA SURGERY     congenital narrowing repair age 66 1/69 years old   COLONOSCOPY     with polyps   FOOT SURGERY Left    MELANOMA EXCISION Right 03/13/1988   RIGHT posterior shoulder     Social History:  The patient  reports that he quit smoking about 9 years ago. His smoking use included cigarettes. He started smoking about 29 years ago. He has a 10 pack-year smoking history. He has quit using smokeless tobacco. He reports that he does not currently use alcohol. He reports that he does not currently use drugs after having used the following drugs: Hydrocodone .   Family History:  The patient's family history includes Cancer in his sister; Diabetes in his father, mother, and sister; Early death in his father; Heart disease in his brother, father, and mother; Hyperlipidemia in his mother and sister; Hypertension in his father.    ROS:  Please see the history of present illness. All other systems are reviewed and  Negative to the above problem except as noted.  PHYSICAL EXAM: VS:  BP 120/64 (BP Location: Left Arm, Patient Position: Sitting, Cuff Size: Normal)   Pulse 66   Ht 5' 9 (1.753 m)   Wt 154 lb 9.6 oz (70.1 kg)   SpO2 97%   BMI 22.83 kg/m   GEN: Pt is in no acute distress  HEENT: normal  Neck: no JVD  L carotid bruit   Cardiac: RRR;  II/VI systolic murmur base   Chest   Mumur heard L back Respiratory:  clear to auscultation  GI: soft, nontender  No hepatomegaly  Ext  No LE edema  EKG:  EKG is  ordered today.   NSR   Nonspecific ST changes     Feb 2025  MRI 1. Post ductal aortic coarctation repair, measuring 1.6 cm at the aortic isthmus. 2. Stable 4.2 cm fusiform dilatation of the ascending thoracic aorta. Continue annual imaging followup by CTA or MRA. This recommendation follows 2010 ACCF/AHA/AATS/ACR/ASA/SCA/SCAI/SIR/STS/SVM Guidelines for the Diagnosis and Management of Patients with Thoracic  Aortic Disease. Circulation. 2010; 121: Z733-z630. Aortic aneurysm NOS (ICD10-I71.9)   Carotid USN  Jun 2024  Right Carotid: Velocities in the right ICA are consistent with a 40-59%  stenosis.  Left Carotid: Velocities in the left ICA are consistent with a 1-39%  stenosis.  Vertebrals: Bilateral vertebral arteries demonstrate antegrade flow.  Subclavians: Normal flow hemodynamics were seen in bilateral subclavian  arteries.  MRI   2/ 2024 1. Similar appearance of repaired aortic coarctation with residual narrowing. The narrowest point measures between 1.4-1.6 cm in the region of the aortic isthmus. 2. 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 Recommend annual imaging followup by CTA or MRA. This recommendation follows 2010 ACCF/AHA/AATS/ACR/ASA/SCA/SCAI/SIR/STS/SVM Guidelines for the Diagnosis and Management of Patients with Thoracic Aortic Disease. Circulation. 2010; 121: Z733-z630. Aortic aneurysm NOS (ICD10-I71.9) 3. Stable dilation of the aortic root with a maximal diameter of 4.7 cm.months  Echo  Jan 2024 1. Left ventricular ejection fraction, by estimation, is 55 to 60% . The left ventricle has normal function. The left ventricle has no regional wall motion abnormalities. Left ventricular diastolic parameters are consistent with Grade I diastolic dysfunction ( impaired relaxation) .  2. Right ventricular systolic function is normal. The right ventricular size is normal. Tricuspid regurgitation signal is inadequate for assessing PA pressure. 3. The mitral valve is normal in structure. Trivial mitral valve regurgitation. No evidence of mitral stenosis.  4. The aortic valve is tricuspid. There is moderate calcification of the aortic valve. Aortic valve regurgitation is moderate. No aortic stenosis is present.  5. Turbulence in the proximal descending thoracic aorta with peak gradient 25 mmHg. History of repair of  coarctation of aorta. Consider evaluation by CTA. Aortic dilatation noted. There is mild dilatation of the aortic root, measuring 42 mm. There is moderate dilatation of the ascending aorta, measuring 44 mm.  6. The inferior vena cava is normal in size with greater than 50% respiratory variability, suggesting right atrial pressure of 3 mmHg.   Lipid Panel    Component Value Date/Time   CHOL 140 02/22/2022 0927   TRIG 51 02/22/2022 0927   HDL 60 02/22/2022 0927   CHOLHDL 2.3 02/22/2022 0927   CHOLHDL 2.7 12/21/2015 0810   VLDL 11 12/21/2015 0810   LDLCALC 69 02/22/2022 0927      Wt Readings from Last 3 Encounters:  03/28/24 154 lb 9.6 oz (70.1 kg)  02/02/24 155 lb (70.3 kg)  12/28/23  155 lb (70.3 kg)      ASSESSMENT AND PLAN:  1 Hx dilated aorta and coarctation of aorta   Pt is s/p repair of coarctation of the aorta   MRI in Feb 2025 Asc aorta 42 mm    Smallest region of descending aorta is 16 mm.  Peak gradient by echo 25 mm (2024)    Will repeat echo   2    Aortic regurgitations    Will repeat echo to reevaluate  3  CV dz  CV study above  pt s/p L CAD  Mild to moderate plaquing  Will need follow up USN  4  HTN  BP remains welll controlled     5  HL  Last lipids in May 2025  LDL 88  Trig 57  HDL 62    Pt on Zetia  and Crestor    Wil repeat  With plaquing should be lower  Reviewed diet esp since last A1C was 5.9         Tentative follow up in 1 year    Current medicines are reviewed at length with the patient today.  The patient does not have concerns regarding medicines.  Signed, Vina Gull, MD  "

## 2024-03-28 ENCOUNTER — Ambulatory Visit: Attending: Internal Medicine | Admitting: Internal Medicine

## 2024-03-28 ENCOUNTER — Encounter: Payer: Self-pay | Admitting: Internal Medicine

## 2024-03-28 VITALS — BP 120/64 | HR 66 | Ht 69.0 in | Wt 154.6 lb

## 2024-03-28 DIAGNOSIS — Q251 Coarctation of aorta: Secondary | ICD-10-CM

## 2024-03-28 DIAGNOSIS — Z8774 Personal history of (corrected) congenital malformations of heart and circulatory system: Secondary | ICD-10-CM | POA: Diagnosis not present

## 2024-03-28 DIAGNOSIS — I10 Essential (primary) hypertension: Secondary | ICD-10-CM

## 2024-03-28 DIAGNOSIS — I6523 Occlusion and stenosis of bilateral carotid arteries: Secondary | ICD-10-CM

## 2024-03-28 DIAGNOSIS — I77810 Thoracic aortic ectasia: Secondary | ICD-10-CM | POA: Diagnosis not present

## 2024-03-28 NOTE — Patient Instructions (Signed)
 Medication Instructions:  The current medical regimen is effective;  continue present plan and medications.  *If you need a refill on your cardiac medications before your next appointment, please call your pharmacy*  Testing/Procedures: Your physician has requested that you have an echocardiogram. Echocardiography is a painless test that uses sound waves to create images of your heart. It provides your doctor with information about the size and shape of your heart and how well your hearts chambers and valves are working. This procedure takes approximately one hour. There are no restrictions for this procedure. Please do NOT wear cologne, perfume, aftershave, or lotions (deodorant is allowed). Please arrive 15 minutes prior to your appointment time.  Please note: We ask at that you not bring children with you during ultrasound (echo/ vascular) testing. Due to room size and safety concerns, children are not allowed in the ultrasound rooms during exams. Our front office staff cannot provide observation of children in our lobby area while testing is being conducted. An adult accompanying a patient to their appointment will only be allowed in the ultrasound room at the discretion of the ultrasound technician under special circumstances. We apologize for any inconvenience.  Follow-Up: At [facility] you and your health needs are our priority.  As part of our continuing mission to provide you with exceptional heart care, our providers are all part of one team.  This team includes your primary Cardiologist (physician) and Advanced Practice Providers or APPs (Physician Assistants and Nurse Practitioners) who all work together to provide you with the care you need, when you need it.  Your next appointment:   1 year(s)  Provider:   Vina Gull, MD    We recommend signing up for the patient portal called MyChart.  Sign up information is provided on this After Visit Summary.  MyChart is used to  connect with patients for Virtual Visits (Telemedicine).  Patients are able to view lab/test results, encounter notes, upcoming appointments, etc.  Non-urgent messages can be sent to your provider as well.   To learn more about what you can do with MyChart, go to [email or web address].

## 2024-03-31 ENCOUNTER — Other Ambulatory Visit: Payer: Self-pay

## 2024-03-31 DIAGNOSIS — Z79899 Other long term (current) drug therapy: Secondary | ICD-10-CM

## 2024-03-31 DIAGNOSIS — I77810 Thoracic aortic ectasia: Secondary | ICD-10-CM

## 2024-03-31 DIAGNOSIS — Q251 Coarctation of aorta: Secondary | ICD-10-CM

## 2024-03-31 DIAGNOSIS — E785 Hyperlipidemia, unspecified: Secondary | ICD-10-CM

## 2024-03-31 DIAGNOSIS — I6523 Occlusion and stenosis of bilateral carotid arteries: Secondary | ICD-10-CM

## 2024-04-02 ENCOUNTER — Ambulatory Visit
Admission: EM | Admit: 2024-04-02 | Discharge: 2024-04-02 | Disposition: A | Discharge status: Home / Self Care | Attending: Student | Admitting: Student

## 2024-04-02 ENCOUNTER — Encounter: Payer: Self-pay | Admitting: Emergency Medicine

## 2024-04-02 DIAGNOSIS — G8929 Other chronic pain: Secondary | ICD-10-CM | POA: Diagnosis not present

## 2024-04-02 DIAGNOSIS — M545 Low back pain, unspecified: Secondary | ICD-10-CM | POA: Diagnosis not present

## 2024-04-02 MED ORDER — PREDNISONE 10 MG PO TABS: 10.0000 mg | ORAL_TABLET | Freq: Every day | ORAL | 0 refills | Status: AC

## 2024-04-02 MED ORDER — BACLOFEN 5 MG PO TABS: 1.0000 | ORAL_TABLET | Freq: Three times a day (TID) | ORAL | 0 refills | Status: AC | PRN

## 2024-04-02 NOTE — ED Provider Notes (Signed)
 " [facility]    CSN: [id number] Arrival date & time: 04/02/24  1044      History   Chief Complaint No chief complaint on file.   HPI Gabriel Pittman is a 69 y.o. male presenting w acute on chronic neck pain.  He is also followed by Ortho for this issue - diagnosis of bone spurs and arthritis. Has had neck injections and PT for this issue. Was told that ablation surgery could be an option in the future, if his symptoms worsen. Gabriel Pittman Pt presents with chronic neck pain which has been on going for about 4 years. States this flare up began about 4 days ago. Has taken ibuprofen for the current symptoms.  Denies radicular symptoms including upper extremity weakness or tingling.  Last visit for the same symptoms was 02/02/24, and he was prescribed a prednisone  taper, which is the only treatment that really works for him.  Prior to that, also had a steroid taper 12/2023.      HPI  Past Medical History:  Diagnosis Date   Allergy    Anxiety    Arthritis    Cancer (HCC)    [employer] 12/10/2019   Carotid artery disease    Coarctation of aorta    a. s/p remote surgery.   Depression    Essential hypertension    Former tobacco use    Hyperlipidemia    Melanoma (HCC) 1990   RIGHT posterior shoulder   Polysubstance abuse (HCC)    a. former alcoholic, drug use (all of them). Sober for 11 years as of 2019.   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 Authorizing Provider  Baclofen  5 MG TABS Take 1 tablet (5 mg total) by mouth 3 (three) times daily as needed. 04/02/24  Yes Lacye Mccarn E, PA-C  predniSONE  (DELTASONE ) 10 MG tablet Take 1 tablet (10 mg total) by mouth daily with breakfast. Take 6 tabs by mouth daily  for 2 days, then 5 tabs for 2 days, then 4 tabs for 2 days, then 3 tabs for 2 days, 2 tabs for 2 days, then 1 tab by mouth daily for 2 days 04/02/24  Yes Arlyss Leita BRAVO, PA-C  augmented betamethasone  dipropionate (DIPROLENE -AF) 0.05 % cream Apply topically as needed.    [provider]  ezetimibe  (ZETIA ) 10 MG tablet Take 1 tablet (10 mg total) by mouth daily. 08/09/23   Okey Vina GAILS, MD  Multiple Vitamin (MULTIVITAMIN) capsule Take 1 capsule by mouth daily.    [provider]  rosuvastatin  (CRESTOR ) 20 MG tablet Take 1 tablet (20 mg total) by mouth daily. 04/30/23   Okey Vina GAILS, MD    Family History Family History  Problem Relation Age of Onset   Heart disease Mother    Diabetes Mother    Hyperlipidemia Mother    Heart disease Father    Diabetes Father    Hypertension Father    Early death Father    Cancer Sister        per patient of the bone   Hyperlipidemia Sister    Diabetes Sister    Heart disease Brother    Colon cancer Neg Hx    Colon polyps Neg Hx    Esophageal cancer Neg Hx    Stomach cancer Neg Hx    Rectal cancer Neg Hx     Social History Social History[1]   Allergies   Codeine and Levofloxacin   Review of Systems Review of Systems  Musculoskeletal:  Positive for neck pain.     Physical Exam Triage Vital  Signs ED Triage Vitals  Encounter Vitals Group     BP 04/02/24 1057 (!) 154/80     Girls Systolic BP Percentile --      Girls Diastolic BP Percentile --      Boys Systolic BP Percentile --      Boys Diastolic BP Percentile --      Pulse Rate 04/02/24 1057 73     Resp 04/02/24 1057 17     Temp 04/02/24 1057 97.9 F (36.6 C)     Temp Source 04/02/24 1057 Oral     SpO2 04/02/24 1057 98 %     Weight --      Height --      Head Circumference --      Peak Flow --      Pain Score 04/02/24 1053 7     Pain Loc --      Pain Education --      Exclude from Growth Chart --    No data found.  Updated Vital Signs BP (!) 154/80 (BP Location: Right Arm)   Pulse 73   Temp 97.9 F (36.6 C) (Oral)   Resp 17   SpO2 98%   Visual Acuity Right Eye Distance:   Left Eye Distance:   Bilateral Distance:    Right Eye Near:   Left Eye Near:    Bilateral Near:     Physical Exam Vitals reviewed.  Constitutional:      General: He is not in acute distress.    Appearance: Normal appearance. He is not ill-appearing.  HENT:     Head: Normocephalic and atraumatic.  Pulmonary:     Effort: Pulmonary effort is normal.  Musculoskeletal:     Comments: Tender to palpation along the distal cervical spine, without bony abnormality.  Pain is elicited with chin to chest.  Negative Spurling bilaterally.  Strength 5/5 upper extremities.  Sensation intact upper extremities.  Neurological:     General: No focal deficit present.     Mental Status: He is alert and oriented to person, place, and time.  Psychiatric:        Mood and Affect: Mood normal.        Behavior: Behavior normal.        Thought Content: Thought content normal.        Judgment: Judgment normal.      UC Treatments / Results  Labs (all labs ordered are listed, but  only abnormal results are displayed) Labs Reviewed - No data to display  EKG   Radiology No results found.  Procedures Procedures (including critical care  time)  Medications Ordered in UC Medications - No data to display  Initial Impression / Assessment and Plan / UC Course  I have reviewed the triage vital signs and the nursing notes.  Pertinent labs & imaging results that were available during my care of the patient were reviewed by me and considered in my medical decision making (see chart for details).     Patient is a pleasant 69 y.o. male presenting with acute exacerbation of chronic neck pain. The patient is afebrile and nontachycardic.  Antipyretic has not been administered today.  Has had multiple prednisone  tapers for this issue in the last few months (01/2024, 12/2023).  This is the only medication that really works for him. Last A1c was 5.9 on 02/2023. He is also followed by Ortho, and has had physical therapy, neck injections, all with minimal relief.  Gabapentin was effective in the past. Kidney function wnl 02/2023 CMP.  Prednisone  taper and baclofen  sent.  Encouraged him to follow-up with his orthopedist, as he is interested in going back on the gabapentin.  -Coding Level 4 for acute exacerbation of chronic condition and prescription drug management.  Final Clinical Impressions(s) / UC Diagnoses   Final diagnoses:  Acute exacerbation of chronic low back pain     Discharge Instructions      - Prednisone  taper as prescribed.  For pain, take Tylenol .  Try to limit ibuprofen while you take the prednisone , as this can be hard on the stomach. - You can take the muscle relaxer Baclofen  up to 3 times daily for muscle spasms and pain.  This medication will make you drowsy, so take it when you are home and do not need to drive. - Follow-up with your neck doctor to discuss further interventions, like gabapentin. -Continue physical therapy at home.     ED Prescriptions     Medication Sig Dispense Auth. Provider   Baclofen  5 MG TABS Take 1 tablet (5 mg total) by mouth 3 (three) times daily as needed. 21 tablet Deseri Loss,  Madoc Holquin E, PA-C   predniSONE  (DELTASONE ) 10 MG tablet Take 1 tablet (10 mg total) by mouth daily with breakfast. Take 6 tabs by mouth daily  for 2 days, then 5 tabs for 2 days, then 4 tabs for 2 days, then 3 tabs for 2 days, 2 tabs for 2 days, then 1 tab by mouth daily for 2 days 42 tablet Tobie Perdue E, PA-C      PDMP not reviewed this encounter.     [1]  Social History Tobacco Use   Smoking status: Former    Current packs/day: 0.00    Average packs/day: 0.5 packs/day for 20.0 years (10.0 ttl pk-yrs)    Types: Cigarettes    Start date: 03/14/1995    Quit date: 03/14/2015    Years since quitting: 9.0   Smokeless tobacco: Former  [employer]   Vaping status: Former  Substance Use Topics   Alcohol use: Not Currently    Comment: 2007   Drug use: Not Currently    Types: Hydrocodone     Comment: 2007     Arlyss Leita BRAVO, [place] 04/02/24 1114  "

## 2024-04-02 NOTE — ED Triage Notes (Signed)
 Pt presents with chronic neck pain which has been on going for about 4 years. States this flare up began about 4 days ago.

## 2024-04-02 NOTE — Discharge Instructions (Addendum)
-   Prednisone  taper as prescribed.  For pain, take Tylenol .  Try to limit ibuprofen while you take the prednisone , as this can be hard on the stomach. - You can take the muscle relaxer Baclofen  up to 3 times daily for muscle spasms and pain.  This medication will make you drowsy, so take it when you are home and do not need to drive. - Follow-up with your neck doctor to discuss further interventions, like gabapentin. -Continue physical therapy at home.

## 2024-04-16 ENCOUNTER — Other Ambulatory Visit: Payer: Self-pay | Admitting: Internal Medicine

## 2024-04-16 DIAGNOSIS — E7849 Other hyperlipidemia: Secondary | ICD-10-CM

## 2024-04-21 ENCOUNTER — Ambulatory Visit: Admitting: Family Medicine

## 2024-05-01 ENCOUNTER — Ambulatory Visit (HOSPITAL_COMMUNITY)

## 2025-01-02 ENCOUNTER — Ambulatory Visit
